# Patient Record
Sex: Female | Born: 1945 | Race: White | Hispanic: No | Marital: Married | Smoking: Never smoker
Health system: Southern US, Community
[De-identification: ages and names within clinical notes are randomized; demographics above are authoritative.]

## PROBLEM LIST (undated history)

## (undated) DIAGNOSIS — I10 Essential (primary) hypertension: Secondary | ICD-10-CM

## (undated) DIAGNOSIS — M160 Bilateral primary osteoarthritis of hip: Secondary | ICD-10-CM

## (undated) DIAGNOSIS — J189 Pneumonia, unspecified organism: Secondary | ICD-10-CM

## (undated) DIAGNOSIS — I441 Atrioventricular block, second degree: Secondary | ICD-10-CM

## (undated) DIAGNOSIS — F419 Anxiety disorder, unspecified: Secondary | ICD-10-CM

## (undated) DIAGNOSIS — H269 Unspecified cataract: Secondary | ICD-10-CM

## (undated) DIAGNOSIS — E119 Type 2 diabetes mellitus without complications: Secondary | ICD-10-CM

## (undated) DIAGNOSIS — E785 Hyperlipidemia, unspecified: Secondary | ICD-10-CM

## (undated) DIAGNOSIS — J40 Bronchitis, not specified as acute or chronic: Secondary | ICD-10-CM

## (undated) DIAGNOSIS — B029 Zoster without complications: Secondary | ICD-10-CM

## (undated) DIAGNOSIS — Z950 Presence of cardiac pacemaker: Secondary | ICD-10-CM

## (undated) DIAGNOSIS — C4490 Unspecified malignant neoplasm of skin, unspecified: Secondary | ICD-10-CM

## (undated) HISTORY — DX: Hyperlipidemia, unspecified: E78.5

## (undated) HISTORY — DX: Zoster without complications: B02.9

## (undated) HISTORY — DX: Anxiety disorder, unspecified: F41.9

## (undated) HISTORY — PX: SKIN CANCER EXCISION: SHX779

## (undated) HISTORY — PX: OTHER SURGICAL HISTORY: SHX169

## (undated) HISTORY — DX: Type 2 diabetes mellitus without complications: E11.9

## (undated) HISTORY — PX: JOINT REPLACEMENT: SHX530

## (undated) HISTORY — PX: TOTAL HIP ARTHROPLASTY: SHX124

## (undated) HISTORY — DX: Essential (primary) hypertension: I10

## (undated) HISTORY — DX: Atrioventricular block, second degree: I44.1

## (undated) HISTORY — PX: TONSILLECTOMY AND ADENOIDECTOMY: SUR1326

---

## 1969-02-10 DIAGNOSIS — J189 Pneumonia, unspecified organism: Secondary | ICD-10-CM

## 1969-02-10 HISTORY — DX: Pneumonia, unspecified organism: J18.9

## 2010-12-12 ENCOUNTER — Ambulatory Visit (HOSPITAL_COMMUNITY)
Admission: RE | Admit: 2010-12-12 | Discharge: 2010-12-12 | Disposition: A | Discharge status: Home / Self Care | Payer: 59 | Source: Ambulatory Visit | Attending: Orthopedic Surgery | Admitting: Orthopedic Surgery

## 2010-12-12 ENCOUNTER — Other Ambulatory Visit: Payer: Self-pay | Admitting: Orthopedic Surgery

## 2010-12-12 ENCOUNTER — Encounter (HOSPITAL_COMMUNITY): Payer: 59

## 2010-12-12 ENCOUNTER — Other Ambulatory Visit (HOSPITAL_COMMUNITY): Payer: Self-pay | Admitting: Orthopedic Surgery

## 2010-12-12 DIAGNOSIS — Z01818 Encounter for other preprocedural examination: Secondary | ICD-10-CM

## 2010-12-12 DIAGNOSIS — E7800 Pure hypercholesterolemia, unspecified: Secondary | ICD-10-CM | POA: Insufficient documentation

## 2010-12-12 DIAGNOSIS — I10 Essential (primary) hypertension: Secondary | ICD-10-CM | POA: Insufficient documentation

## 2010-12-12 DIAGNOSIS — Z79899 Other long term (current) drug therapy: Secondary | ICD-10-CM | POA: Insufficient documentation

## 2010-12-12 DIAGNOSIS — Z96649 Presence of unspecified artificial hip joint: Secondary | ICD-10-CM | POA: Insufficient documentation

## 2010-12-12 DIAGNOSIS — M169 Osteoarthritis of hip, unspecified: Secondary | ICD-10-CM | POA: Insufficient documentation

## 2010-12-12 DIAGNOSIS — Z01812 Encounter for preprocedural laboratory examination: Secondary | ICD-10-CM | POA: Insufficient documentation

## 2010-12-12 DIAGNOSIS — M1610 Unilateral primary osteoarthritis, unspecified hip: Secondary | ICD-10-CM | POA: Insufficient documentation

## 2010-12-12 LAB — COMPREHENSIVE METABOLIC PANEL
ALT: 21 U/L (ref 0–35)
Albumin: 4.9 g/dL (ref 3.5–5.2)
Alkaline Phosphatase: 81 U/L (ref 39–117)
BUN: 20 mg/dL (ref 6–23)
Chloride: 101 mEq/L (ref 96–112)
Potassium: 4.3 mEq/L (ref 3.5–5.1)
Sodium: 139 mEq/L (ref 135–145)
Total Bilirubin: 0.6 mg/dL (ref 0.3–1.2)

## 2010-12-12 LAB — PROTIME-INR: INR: 0.99 (ref 0.00–1.49)

## 2010-12-12 LAB — URINALYSIS, ROUTINE W REFLEX MICROSCOPIC
Bilirubin Urine: NEGATIVE
Glucose, UA: NEGATIVE mg/dL
Hgb urine dipstick: NEGATIVE
Protein, ur: NEGATIVE mg/dL
Specific Gravity, Urine: 1.011 (ref 1.005–1.030)
Urobilinogen, UA: 0.2 mg/dL (ref 0.0–1.0)

## 2010-12-12 LAB — CBC
HCT: 45.2 % (ref 36.0–46.0)
MCHC: 32.7 g/dL (ref 30.0–36.0)
MCV: 87.3 fL (ref 78.0–100.0)
RDW: 12.7 % (ref 11.5–15.5)

## 2010-12-21 ENCOUNTER — Inpatient Hospital Stay (HOSPITAL_COMMUNITY)
Admission: RE | Admit: 2010-12-21 | Discharge: 2010-12-25 | DRG: 470 | Disposition: A | Discharge status: Home Health | Payer: 59 | Source: Ambulatory Visit | Attending: Orthopedic Surgery | Admitting: Orthopedic Surgery

## 2010-12-21 ENCOUNTER — Inpatient Hospital Stay (HOSPITAL_COMMUNITY): Payer: 59

## 2010-12-21 DIAGNOSIS — M1610 Unilateral primary osteoarthritis, unspecified hip: Principal | ICD-10-CM | POA: Diagnosis present

## 2010-12-21 DIAGNOSIS — I498 Other specified cardiac arrhythmias: Secondary | ICD-10-CM | POA: Diagnosis not present

## 2010-12-21 DIAGNOSIS — Z01812 Encounter for preprocedural laboratory examination: Secondary | ICD-10-CM

## 2010-12-21 DIAGNOSIS — E785 Hyperlipidemia, unspecified: Secondary | ICD-10-CM | POA: Diagnosis present

## 2010-12-21 DIAGNOSIS — E871 Hypo-osmolality and hyponatremia: Secondary | ICD-10-CM | POA: Diagnosis not present

## 2010-12-21 DIAGNOSIS — Z96649 Presence of unspecified artificial hip joint: Secondary | ICD-10-CM

## 2010-12-21 DIAGNOSIS — M169 Osteoarthritis of hip, unspecified: Principal | ICD-10-CM | POA: Diagnosis present

## 2010-12-21 DIAGNOSIS — E119 Type 2 diabetes mellitus without complications: Secondary | ICD-10-CM | POA: Diagnosis present

## 2010-12-21 DIAGNOSIS — I10 Essential (primary) hypertension: Secondary | ICD-10-CM | POA: Diagnosis present

## 2010-12-21 DIAGNOSIS — D649 Anemia, unspecified: Secondary | ICD-10-CM | POA: Diagnosis not present

## 2010-12-21 LAB — GLUCOSE, CAPILLARY
Glucose-Capillary: 124 mg/dL — ABNORMAL HIGH (ref 70–99)
Glucose-Capillary: 113 mg/dL — ABNORMAL HIGH (ref 70–99)

## 2010-12-21 LAB — TYPE AND SCREEN: Antibody Screen: NEGATIVE

## 2010-12-22 DIAGNOSIS — I498 Other specified cardiac arrhythmias: Secondary | ICD-10-CM

## 2010-12-22 LAB — CBC
Hemoglobin: 9.9 g/dL — ABNORMAL LOW (ref 12.0–15.0)
Platelets: 169 10*3/uL (ref 150–400)
RBC: 3.45 MIL/uL — ABNORMAL LOW (ref 3.87–5.11)
WBC: 5.9 10*3/uL (ref 4.0–10.5)

## 2010-12-22 LAB — BASIC METABOLIC PANEL
CO2: 27 mEq/L (ref 19–32)
Calcium: 8.9 mg/dL (ref 8.4–10.5)
Glucose, Bld: 162 mg/dL — ABNORMAL HIGH (ref 70–99)
Potassium: 4.2 mEq/L (ref 3.5–5.1)
Sodium: 135 mEq/L (ref 135–145)

## 2010-12-22 LAB — GLUCOSE, CAPILLARY: Glucose-Capillary: 143 mg/dL — ABNORMAL HIGH (ref 70–99)

## 2010-12-23 DIAGNOSIS — E119 Type 2 diabetes mellitus without complications: Secondary | ICD-10-CM

## 2010-12-23 DIAGNOSIS — I441 Atrioventricular block, second degree: Secondary | ICD-10-CM

## 2010-12-23 LAB — BASIC METABOLIC PANEL
CO2: 25 mEq/L (ref 19–32)
Calcium: 8.8 mg/dL (ref 8.4–10.5)
GFR calc non Af Amer: >60 mL/min (ref >60)
Glucose, Bld: 140 mg/dL — ABNORMAL HIGH (ref 70–99)
Potassium: 3.5 mEq/L (ref 3.5–5.1)
Sodium: 131 mEq/L — ABNORMAL LOW (ref 135–145)

## 2010-12-23 LAB — CBC
HCT: 27.2 % — ABNORMAL LOW (ref 36.0–46.0)
MCV: 88 fL (ref 78.0–100.0)
Platelets: 164 10*3/uL (ref 150–400)
RBC: 3.09 MIL/uL — ABNORMAL LOW (ref 3.87–5.11)
RDW: 13.2 % (ref 11.5–15.5)
WBC: 8.3 10*3/uL (ref 4.0–10.5)

## 2010-12-23 LAB — MAGNESIUM: Magnesium: 2 mg/dL (ref 1.5–2.5)

## 2010-12-24 LAB — BASIC METABOLIC PANEL
CO2: 26 mEq/L (ref 19–32)
Chloride: 105 mEq/L (ref 96–112)
Glucose, Bld: 133 mg/dL — ABNORMAL HIGH (ref 70–99)
Sodium: 137 mEq/L (ref 135–145)

## 2010-12-24 LAB — CBC
Hemoglobin: 8.3 g/dL — ABNORMAL LOW (ref 12.0–15.0)
Platelets: 164 10*3/uL (ref 150–400)
RBC: 2.86 MIL/uL — ABNORMAL LOW (ref 3.87–5.11)
WBC: 8 10*3/uL (ref 4.0–10.5)

## 2010-12-26 NOTE — H&P (Signed)
[name], MA             ACCOUNT NO.:  [id number]  MEDICAL RECORD NO.:  [id number]  LOCATION:                                 FACILITY:  PHYSICIAN:  Lauren Norton, M.D.    DATE OF BIRTH:  February 12, 1946  DATE OF ADMISSION: DATE OF DISCHARGE:                             HISTORY & PHYSICAL   CHIEF COMPLAINT:  Left hip pain.  HISTORY OF PRESENT ILLNESS:  The patient is a 65 year old female who has been seen by Dr. Lequita Norton for ongoing left hip pain.  She has been told in the past that she has arthritis in the hip and that has progressively gotten worse with time.  She has been seen by Dr. Lequita Norton, where x-rays showed end-stage arthritis of the left hip.  She has been trying to exercise, do a lot of deep water aerobics.  She had a previous right hip replacement back in November 2001, but the left hip is bothering her. At this point, she would like to have it replaced.  Risks and benefits have been discussed.  She elects to proceed with surgery.  She has been seen by Dr. Felipa Norton preoperatively and felt to be stable for upcoming procedure.  ALLERGIES:  SULFA drugs.  CURRENT MEDICATIONS:  Lisinopril, atorvastatin, cetirizine.  PAST MEDICAL HISTORY: 1. Past history of shingles. 2. Hypertension. 3. Hypercholesterolemia. 4. Hemorrhoids. 5. Diet-controlled diabetes mellitus. 6. Postmenopausal. 7. Past history of urinary tract infection.  PAST SURGICAL HISTORY:  Right total hip replacement in November 2001.  FAMILY HISTORY:  Father with heart disease, diabetes, currently living at age 73.  Mother deceased at age 45 with Alzheimer's.  She has a 82- year-old sister who is in good health.  SOCIAL HISTORY:  Married, retired Runner, broadcasting/film/video.  Nonsmoker.  Occasional intake of alcohol socially; 3 children, all in good health.  The patient's husband and son will be assisting with care after surgery. She does have a living will.  REVIEW OF SYSTEMS:  GENERAL:  No fevers, chills, or night  sweats. NEURO:  No seizures, syncope, or paralysis.  RESPIRATORY:  She has some seasonal allergies but no shortness breath, productive cough, or hemoptysis.  CARDIOVASCULAR:  No chest pain, no orthopnea.  GI:  A little bit of constipation.  No nausea, vomiting, diarrhea.  GU:  Little bit of nocturia.  No dysuria or hematuria.  MUSCULOSKELETAL:  Hip pain.  PHYSICAL EXAMINATION:  VITAL SIGNS:  Pulse 84, respirations 16, blood pressure 130/68. GENERAL:  A 65 year old white female, well nourished, well developed, no acute distress.  She is alert, oriented, and cooperative. HEENT:  Normocephalic, atraumatic.  Pupils round and reactive.  EOMs intact.  Never wore glasses. NECK:  Supple. CHEST:  Clear. HEART:  Regular rate and rhythm without murmur. ABDOMEN:  Soft, nontender.  Bowel sounds present. RECTAL:  Not done, not pertinent to present illness. BREASTS:  Not done, not pertinent to present illness. GENITALIA:  Not done, not pertinent to present illness. EXTREMITIES:  Left hip flexion 90, 0 internal rotation, 0 external rotation, 20 degrees abduction.  IMPRESSION:  Osteoarthritis, left hip.  PLAN:  The patient will be admitted to [facility] to undergo left total hip replacement arthroplasty.  Surgery will be performed by Dr. Ollen Norton.     Lauren Norton, P.A.C.   ______________________________ Lauren Norton, M.D.    ALP/MEDQ  D:  12/18/2010  T:  12/18/2010  Job:  [id number]  cc:   Lauren Norton, M.D. Fax: [phone]  Lauren Norton, M.D. Fax: [phone]  Electronically Signed by Patrica Duel P.A.C. on 12/22/2010 10:43:54 AM Electronically Signed by Lauren Norton M.D. on 12/26/2010 06:52:04 AM

## 2010-12-26 NOTE — Op Note (Signed)
Lauren Norton, Lauren Norton             ACCOUNT NO.:  [id number]  MEDICAL RECORD NO.:  [id number]  LOCATION:  0003                         FACILITY:  [facility]  PHYSICIAN:  Ollen Gross, M.D.    DATE OF BIRTH:  07/30/1945  DATE OF PROCEDURE:  12/21/2010 DATE OF DISCHARGE:                              OPERATIVE REPORT   PREOPERATIVE DIAGNOSIS:  Osteoarthritis, left hip.  POSTOPERATIVE DIAGNOSIS:  Osteoarthritis, left hip.  PROCEDURE:  Left total hip arthroplasty.  SURGEON:  Ollen Gross, M.D.  ASSISTANT:  Alexzandrew L. Perkins, P.A.C.  ANESTHESIA:  Spinal.  ESTIMATED BLOOD LOSS:  250.Marland Kitchen  DRAIN:  Hemovac x1.  COMPLICATIONS:  None.  CONDITION:  Stable to recovery.  BRIEF CLINICAL NOTE:  Ms. Ke is a 65 year old female with end-stage arthritis of the left hip with progressively worsening pain and dysfunction.  She had a successful right total hip arthroplasty several years ago in [place] and presents now for left total hip arthroplasty.  PROCEDURE IN DETAIL:  After successful administration of spinal anesthetic, the patient was placed in the right lateral decubitus position with the left side up and held with the hip positioner.  The left lower extremity was isolated from her perineum with plastic drapes and prepped and draped in the usual sterile fashion.  Short posterolateral incision was made with a 10 blade through subcutaneous tissue to the level of fascia lata which was incised in line with the skin incision.  Sciatic nerve was palpated and protected and short rotators were isolated off the posterior femur.  Capsulotomy was performed and the hip was dislocated.  The center of the femoral head was marked and a trial prosthesis was placed such that the center of the trial head corresponds to the center of the native femoral head. Osteotomy line was marked on the femoral neck and osteotomy made with an oscillating saw.  Femoral head was removed and retractors were  placed around the proximal femur for exposure of the proximal femur.  Starter reamer was passed to the femoral canal and then the canal thoroughly irrigated to remove fatty contents.  Axial reaming was performed up to 15.5 mm, proximal reaming to 101F and the sleeve machined to a small.  A 101F small trial sleeve was placed.  The femur was then retracted anteriorly to gain acetabular exposure.  Acetabular retractors were placed and labrum and osteophytes were removed.  Acetabular reaming begins at 47 mm coursing increments of 2 to 53 mm and a 54-mm pinnacle acetabular shell was placed in anatomic position.  It was transfixed with 2 dome screws.  The apex hole eliminator was placed and the 36 mm neutral +4 marathon liner was placed.  The trial femoral stem was placed which is a 20 x 15 the 36 +8 matching native anteversion.  A 36.0 head was placed, reduced easily, so the +3 which had more appropriate soft tissue tension.  Stability excellent with full extension, full external rotation, 70 degrees flexion, 40 degrees adduction, 90 degrees internal rotation and 90 degrees of flexion and about 70 degrees of internal rotation.  By placing the left leg on top of the right, I felt as though the leg lengths were equal.  Hip was dislocated and trials removed. Permanent 64F small sleeve was placed, a 20 x 15 stem, 36 +8 neck matching native anteversion.  A 36 +3 ceramic head was placed and the hip reduced with same stability parameters.  The wound was copiously irrigated with saline solution and then the short rotators and capsule reattached to the femur through drill holes with Ethibond suture. Fascia lata was closed over Hemovac drain with interrupted #1 Vicryl, subcu closed with #1 and #2-0 Vicryl and subcuticular running 4-0 Monocryl.  Incision was then cleaned and dried and the catheter for Marcaine pain pump was placed and pump initiated.  Steri-Strips and a bulky sterile dressing were  applied, and then the patient was subsequently awakened and transported to recovery in stable condition.     Ollen Gross, M.D.     FA/MEDQ  D:  12/21/2010  T:  12/21/2010  Job:  [id number]  Electronically Signed by Ollen Gross M.D. on 12/26/2010 06:52:08 AM

## 2010-12-28 ENCOUNTER — Encounter: Payer: Self-pay | Admitting: Internal Medicine

## 2010-12-28 ENCOUNTER — Ambulatory Visit (INDEPENDENT_AMBULATORY_CARE_PROVIDER_SITE_OTHER): Payer: 59 | Admitting: Internal Medicine

## 2010-12-28 VITALS — BP 92/58 | HR 90 | Ht 67.0 in | Wt 146.0 lb

## 2010-12-28 DIAGNOSIS — I442 Atrioventricular block, complete: Secondary | ICD-10-CM | POA: Insufficient documentation

## 2010-12-28 DIAGNOSIS — D649 Anemia, unspecified: Secondary | ICD-10-CM | POA: Insufficient documentation

## 2010-12-28 DIAGNOSIS — I441 Atrioventricular block, second degree: Secondary | ICD-10-CM

## 2010-12-28 NOTE — Assessment & Plan Note (Signed)
She is recovering from recent post operative anemia. I have discussed the symptoms of anemia which could also be similar to symptoms of bradycardia. She will avoid excessive heat and maintain hydration. She will follow-up with her surgeon for repeat CBC.

## 2010-12-28 NOTE — Assessment & Plan Note (Addendum)
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  Presently, she is conducting 1:1. It is possible that her 2:1 AV block was a vagal response due to pain, narcotics, surgery, lying in bed, etc.  I think that before committing her to a pacemaker, we should evaluate this further. At this point, I will give her 2 weeks to recover from surgery and then place an event monitor.  IF during the 21 event monitor period she develops symptomatic 2:1 block or evidence of infrahisian block, then we will proceed with pacemaker implanation at that time.  If she has no evidence of high grade block or symptoms, then we will continue to follow conservatively.  She will report immediately for symptoms of presyncope or syncope.

## 2010-12-28 NOTE — Patient Instructions (Signed)
Your physician recommends that you schedule a follow-up appointment in: 6 weeks with Dr Allred  Your physician has recommended that you wear an event monitor. Event monitors are medical devices that record the heart's electrical activity. Doctors most often us these monitors to diagnose arrhythmias. Arrhythmias are problems with the speed or rhythm of the heartbeat. The monitor is a small, portable device. You can wear one while you do your normal daily activities. This is usually used to diagnose what is causing palpitations/syncope (passing out).    

## 2010-12-28 NOTE — Progress Notes (Signed)
Lauren Norton is a pleasant 65 y.o. WF patient with a h/o HTN and borderline diabetes who presents today for EP consultation regarding recently discovered 2:1 AV block.  The patient recently was hospitalized for THA at [facility].  During her hospital stay, she was observed to have 2:1 AV block with a narrow complex QRS.  She did not have prolonged RR intervals or symptoms.  She was observed with subsequent resumption of 1:1 AV conduction.  She was discharged with recommendation to be seen in my clinic today.  Since her discharge, she reports doing well.  She denies presyncope or syncope.  She has had no symptoms of bradycardia or AV block.  She continues to improve following her recent surgery. Upon further discussion, she has noticed intermittent episodes of fatigue for several years.  She states that she did not check her pulse during these episodes.  She reports occasional episodes of dizziness.  She denies presyncope or syncope.  Episodic fatigue has occurred every few weeks for several years.  Today, she denies symptoms of palpitations, chest pain, shortness of breath, orthopnea, PND, lower extremity edema, dizziness, presyncope, syncope, or neurologic sequela. The patient is tolerating medications without difficulties and is otherwise without complaint today.   Past Medical History  Diagnosis Date  . HLD (hyperlipidemia)   . HTN (hypertension)   . Second degree AV block     2:1 narrow complex QRS  . Glucose intolerance (impaired glucose tolerance)    Past Surgical History  Procedure Date  . Total hip arthroplasty     bilateral    Current Outpatient Prescriptions  Medication Sig Dispense Refill  . atorvastatin (LIPITOR) 20 MG tablet Take 20 mg by mouth daily.        . cetirizine (ZYRTEC) 10 MG tablet Take 10 mg by mouth daily.        . iron polysaccharides (NIFEREX) 150 MG capsule Take 150 mg by mouth daily.        Marland Kitchen lisinopril (PRINIVIL,ZESTRIL) 5 MG tablet Take 5 mg by mouth  daily.        . methocarbamol (ROBAXIN) 500 MG tablet Take 500 mg by mouth every 6 (six) hours as needed.        Marland Kitchen oxycodone (OXY-IR) 5 MG capsule Take 5 mg by mouth every 4 (four) hours as needed.        . rivaroxaban (XARELTO) 10 MG TABS tablet Take 10 mg by mouth daily.          Allergies  Allergen Reactions  . Sulfa Antibiotics     History   Social History  . Marital Status: Married    Spouse Name: N/A    Number of Children: 3  . Years of Education: N/A   Occupational History  . retired    Social History Main Topics  . Smoking status: Never Smoker   . Smokeless tobacco: Not on file  . Alcohol Use: 1.0 - 1.5 oz/week    2-3 drink(s) per week     wine or mixed drink  . Drug Use: No  . Sexually Active: Not on file   Other Topics Concern  . Not on file   Social History Narrative   Pt lives in [place].  Retired.    Family History  Problem Relation Age of Onset  . Heart attack    . Heart disease    . Diabetes      ROS- All systems are reviewed and negative except as per the  HPI above  Physical Exam: Filed Vitals:   12/28/10 0850  BP: 92/58  Pulse: 90  Height: 5\' 7"  (1.702 m)  Weight: 146 lb (66.225 kg)    GEN- The patient is well appearing, alert and oriented x 3 today.   Head- normocephalic, atraumatic Eyes-  Sclera clear, conjunctiva pink Ears- hearing intact Oropharynx- clear Neck- supple, no JVP Lymph- no cervical lymphadenopathy Lungs- Clear to ausculation bilaterally, normal work of breathing Heart- Regular rate and rhythm, no murmurs, rubs or gallops, PMI not laterally displaced GI- soft, NT, ND, + BS Extremities- no clubbing, cyanosis, 1+ R leg edema edema MS- s/p recent R leg THA Skin- no rash or lesion Psych- euthymic mood, full affect Neuro- strength and sensation are intact  EKG- today reveals sinus rhythm 90 bpm, 1:1 AV conduction and normal ekg Echo reviewed  Assessment and Plan:

## 2010-12-29 NOTE — Consult Note (Signed)
Lauren Norton, WARMOTH NO.:  [id number]  MEDICAL RECORD NO.:  [id number]  LOCATION:  1404                         FACILITY:  [facility]  PHYSICIAN:  Marca Ancona, MD      DATE OF BIRTH:  09-Sep-1945  DATE OF CONSULTATION:  12/22/2010 DATE OF DISCHARGE:                                CONSULTATION   PRIMARY CARE PHYSICIAN:  Larina Earthly, M.D.  HISTORY OF PRESENT ILLNESS:  This is a 65-year with history of hypertension, hyperlipidemia, diet-controlled diabetes who had a left total hip replacement yesterday.  Today, she was noted to be bradycardic with heart rate in the upper 40s.  She was not hypotensive but did get lightheaded with standing.  She did stand for the first time since surgery today.  In 2001, she had a right total hip replacement and remembers being told at that time that her heart rate was low.  She has no history of syncope.  A few months ago, she was taking lisinopril 10 mg daily and she noted lightheadedness with standing on that dose of lisinopril, however, her systolic blood pressure dropped to the 80s and 90s while standing.  Her heart rate was not low.  This resolved with decreased lisinopril to 5 mg daily.  The patient has had no chest pain. She does not really have significant exertional shortness of breath. She had been mainly limited by her hip pain prior to this hospitalization.  She was doing deep water aerobics and was actually doing quite well with that.  Review of EKG shows sinus rhythm with rate 48.  There do appear to be bigeminal block and PACs, however, I cannot fully rule out 2:1 AV block.  PAST MEDICAL HISTORY: 1. Hyperlipidemia. 2. Hypertension. 3. History of zoster 4. Total hip replacement on the right in 2001. 5. Diet-controlled diabetes. 6. History of a stress test and echocardiogram done in 2009 in [place].  According to her report, these were normal.  MEDICATIONS: 1. Lipitor 20 mg daily. 2. Xarelto 10 mg  daily.  SOCIAL HISTORY:  The patient has 3 children.  She is married. Occasionally drinks alcohol.  She does not smoke.  She is a retired Runner, broadcasting/film/video.  FAMILY HISTORY:  Mother with Alzheimer disease.  Father with coronary artery disease.  She had MI in her 62s.  REVIEW OF SYSTEMS:  All systems were reviewed and were negative except as noted in the history of present illness.  PHYSICAL EXAMINATION:  VITAL SIGNS:  Pulse is 48, temperatures 98.1, blood pressure ranges from 103 to 118 over 59 to 61, oxygen saturation 100% on 2 L nasal cannula. GENERAL:  This is a well-developed female, in no apparent stress. HEENT:  Normal exam. ABDOMEN:  Soft, nontender.  No hepatosplenomegaly. NECK:  JVP is about 8 cm of water.  There is no thyromegaly or thyroid nodule. CARDIOVASCULAR:  The patient is mildly bradycardic with a regular rhythm.  There is no murmur.  There is no S3 or S4.  There is trace ankle edema. LUNGS:  Clear to auscultation bilaterally with normal respiratory effort. EXTREMITIES:  No clubbing or cyanosis. NEUROLOGIC:  Alert and orient x3.  Normal affect. SKIN:  There is no rash.  RADIOLOGY AND LABORATORY DATA:  EKG shows sinus rhythm with a rate of 48.  There are bigeminal blocked PACs versus 2:1 AV block.  I did run a rhythm strip on her and there was no change during the rhythm strip.  White count 5.9, hematocrit 30.2, platelets 169, potassium 4.2, creatinine 0.76.  IMPRESSION:  This is a 65 year old status post left total hip replacement who was noted to have bradycardia with heart rate in the 40s.  EKG showed bigeminal block, PVCs versus 2:1 AV block.  Her blood pressure is preserved.  I am going to send her to a telemetry bed to monitor her heart rhythm overnight.  I will check a magnesium and TSH. We will get an echocardiogram, check her LV function.  She has already worked with Physical Therapy today and does not want to get back out of bed, however, tomorrow I would  like to check her heart rate response with exercise.  Her rhythm certainly may be bigeminal block, PACs; however, as mentioned above, I cannot fully rule out 2:1 AV block and she will need further investigation by checking her heart rate response to exercise and watching her on telemetry floor.     Marca Ancona, MD     DM/MEDQ  D:  12/22/2010  T:  12/22/2010  Job:  [id number]  Electronically Signed by Marca Ancona MD on 12/29/2010 01:41:21 PM

## 2011-01-04 ENCOUNTER — Institutional Professional Consult (permissible substitution): Payer: 59 | Admitting: Internal Medicine

## 2011-01-13 ENCOUNTER — Encounter (INDEPENDENT_AMBULATORY_CARE_PROVIDER_SITE_OTHER): Payer: 59

## 2011-01-13 DIAGNOSIS — I441 Atrioventricular block, second degree: Secondary | ICD-10-CM

## 2011-01-18 NOTE — Discharge Summary (Addendum)
Lauren Norton, Lauren Norton             ACCOUNT NO.:  [id number]  MEDICAL RECORD NO.:  [id number]  LOCATION:  1404                         FACILITY:  [facility]  PHYSICIAN:  Ollen Gross, M.D.    DATE OF BIRTH:  03/17/1946  DATE OF ADMISSION:  12/21/2010 DATE OF DISCHARGE:  12/25/2010                              DISCHARGE SUMMARY   ADMISSION DIAGNOSES: 1. Osteoarthritis of left hip. 2. History of shingles. 3. Hypertension. 4. Hypercholesterolemia. 5. Hemorrhoids. 6. Diet-controlled diabetes mellitus. 7. Postmenopausal. 8. Past history of urinary tract infections.  DISCHARGE DIAGNOSES: 1. Postop bradycardia 2. Osteoarthritis of left hip, status post left total hip replacement     arthroplasty. 3. Postop acute blood loss anemia. 4. Postop hyponatremia. 5. History of shingles. 6. Hypertension. 7. Hypercholesterolemia. 8. Hemorrhoids. 9. Diet-controlled diabetes mellitus. 10.Postmenopausal. 11.Past history of urinary tract infections.  PROCEDURE:  December 21, 2010, left total hip.  Surgeon, Dr. Homero Fellers Alieyah Norton. Assistant, Patrica Duel, P.A.C.  Spinal anesthesia.  Blood loss approximately 250 mL.  CONSULTS:  Cardiology consult.  BRIEF HISTORY:  The patient is a 65 year old female with end-stage arthritis of left hip, progressive worsening pain, dysfunction, successful right total hip, now presents for left total hip.  LABORATORY DATA:  Preop CBC not found in the chart, but the hemoglobin preop was 14.8.  Hemoglobin dropped down to 9.9, got as low as last number was 8.3 and 24.8 hematocrit.  Chem panel on admission is not scanned in the chart, but the sodium did drop from 135 to 131, back up to 137.  Remaining electrolytes remained within normal limits.  Blood group and type O negative.  X-rays postop, hip and pelvis film shows satisfactory position of left total hip.  HOSPITAL COURSE:  The patient was admitted to [facility] taken to OR, underwent above-stated  procedure without complication.  The patient tolerated the procedure well, later was transferred to recovery room, to the Orthopedic floor.  She did have some bradycardia through the night.  Her pulse was in the low at the 40s, but it had been up in the 70s, and on morning rounds, it kept dropping around the 40s.  The patient along with her husband states that she had this issue with a previous surgery years ago and had a cardiac workup back in [place] in 2009, prior to moving to [place], which was found to be negative. She has not been seen by Cardiology here, but Dr. Peter Swaziland is taking care with the family and we called in a Cardiology consult.  The patient was seen in evaluation by Cardiology and felt like it could be an AV block.  They were monitored, may benefit from a pacemaker. They held any type of blocking agents and wanted her to be moved to telemetry, which she was placed on and she ambulated while on telemetry. By the following day, she had an echo done and it felt like she may need a pacemaker, but possibly could do it on an outpatient basis.  She did have a drop in her sodium, which was noted on day 2, felt to be a dilutional component.  So,  we will give the order for fluids and Hep- Lock  IV once she was tolerating fluids well.  Hemoglobin was down to 8.8.  We put her on some iron supplements.  If she became symptomatic, then we will give her blood.  Her ACE inhibitor was on hold because of the soft pressure, but again, she was asymptomatic with this.  She did have a bradycardia on the first day or two, but by day 3, her pulse rate was back up in the 60s.  She actually did well with her physical therapy over the weekend, walking on Saturday and even on Sunday, she was doing well.  She had been seen back by Cardiology and they felt that she would likely need a pacer on Monday.  She was asymptomatic with her anemia, so they felt like she would not require transfusion.   She was seen on that Saturday by Cardiology and then on Sunday, December 25, 2010, she was doing well.  The heart block had pretty much resolved and it was decided that she would not undergo the pacemaker placement, that she would be followed back as an outpatient with Cardiology.  She was doing well from her medical standpoint and also from ortho standpoint.  So she was discharged home later that day on December 25, 2010.  DISCHARGE PLAN: 1. The patient discharged home on December 25, 2010. 2. Discharge diagnoses, please see above. 3. Discharge meds. 4. The patient is to follow up with Dr. Lequita Halt in the office in     approximately 2 weeks from surgery.  The patient is also to follow     up with Cardiology a week after discharge or within a week after     discharge.  ACTIVITY:  She is partial weightbearing, 25 to 50%.  Hip precautions, total hip protocol.  DISPOSITION:  Home.  CONDITION ON DISCHARGE:  Improved.     Lauren Norton, P.A.C.   ______________________________ Ollen Gross, M.D.    ALP/MEDQ  D:  01/12/2011  T:  01/13/2011  Job:  [id number]  cc:   Huel Cote, M.D. Fax: [phone]  Larina Earthly, M.D. Fax: [phone]  Electronically Signed by Patrica Duel P.A.C. on 01/18/2011 07:46:25 AM Electronically Signed by Ollen Gross M.D. on 01/23/2011 11:35:05 AM

## 2011-02-06 ENCOUNTER — Encounter: Payer: Self-pay | Admitting: Internal Medicine

## 2011-02-06 ENCOUNTER — Ambulatory Visit (INDEPENDENT_AMBULATORY_CARE_PROVIDER_SITE_OTHER): Payer: 59 | Admitting: Internal Medicine

## 2011-02-06 VITALS — BP 142/92 | HR 80 | Ht 67.0 in | Wt 140.0 lb

## 2011-02-06 DIAGNOSIS — I441 Atrioventricular block, second degree: Secondary | ICD-10-CM

## 2011-02-06 NOTE — Progress Notes (Signed)
Lauren Norton is a pleasant 65 y.o. WF patient with a h/o HTN and borderline diabetes who presents today for EP follow-up regarding recently discovered 2:1 AV block.  The patient recently was hospitalized for THA at [facility].  During her hospital stay, she was observed to have 2:1 AV block with a narrow complex QRS.  She did not have prolonged RR intervals or symptoms.  She was observed with subsequent resumption of 1:1 AV conduction.    Since last being seen in our office, she reports doing well. Her exercise ability has improved significantly since her hip surgery. She denies presyncope or syncope.  She has had no symptoms of bradycardia or AV block.    Today, she denies symptoms of palpitations, chest pain, shortness of breath, orthopnea, PND, lower extremity edema, dizziness, presyncope, syncope, or neurologic sequela. The patient is tolerating medications without difficulties and is otherwise without complaint today.   Past Medical History  Diagnosis Date  . HLD (hyperlipidemia)   . HTN (hypertension)   . Second degree AV block     2:1 narrow complex QRS  . Glucose intolerance (impaired glucose tolerance)    Past Surgical History  Procedure Date  . Total hip arthroplasty     bilateral    Current Outpatient Prescriptions  Medication Sig Dispense Refill  . aspirin 81 MG tablet Take 81 mg by mouth daily.        Marland Kitchen atorvastatin (LIPITOR) 20 MG tablet Take 20 mg by mouth daily.        . cetirizine (ZYRTEC) 10 MG tablet Take 10 mg by mouth daily.          Allergies  Allergen Reactions  . Sulfa Antibiotics     History   Social History  . Marital Status: Married    Spouse Name: N/A    Number of Children: 3  . Years of Education: N/A   Occupational History  . retired    Social History Main Topics  . Smoking status: Never Smoker   . Smokeless tobacco: Not on file  . Alcohol Use: 1.0 - 1.5 oz/week    2-3 drink(s) per week     wine or mixed drink  . Drug Use: No  .  Sexually Active: Not on file   Other Topics Concern  . Not on file   Social History Narrative   Pt lives in [place].  Retired.    Family History  Problem Relation Age of Onset  . Heart attack    . Heart disease    . Diabetes      Physical Exam: Filed Vitals:   02/06/11 1014  BP: 142/92  Pulse: 80  Height: 5\' 7"  (1.702 m)  Weight: 140 lb (63.504 kg)    GEN- The patient is well appearing, alert and oriented x 3 today.   Head- normocephalic, atraumatic Eyes-  Sclera clear, conjunctiva pink Ears- hearing intact Oropharynx- clear Neck- supple, no JVP Lymph- no cervical lymphadenopathy Lungs- Clear to ausculation bilaterally, normal work of breathing Heart- Regular rate and rhythm, no murmurs, rubs or gallops, PMI not laterally displaced GI- soft, NT, ND, + BS Extremities- no clubbing, cyanosis,  MS- s/p recent R leg THA but much more mobile today Skin- no rash or lesion Psych- euthymic mood, full affect Neuro- strength and sensation are intact  EKG- today reveals sinus rhythm 71bpm, 1:1 AV conduction, poor R wave progression  Assessment and Plan:

## 2011-02-06 NOTE — Patient Instructions (Signed)
Your physician recommends that you schedule a follow-up appointment in 3 months with Dr Allred    

## 2011-02-06 NOTE — Assessment & Plan Note (Signed)
Doing very well and asymptomatic at this time I have reviewed her recent event monitor in detail.  This revealed predominantly 1:1 AV conduction.  She did have rare 2:1 AV conduction but no more advanced AV block or prolonged RR intervals. We will therefore continue watchful waiting at this time.  She would like to avoid a pacemaker if possible.  Though she may eventually require pacing, she appears stable at this time. I will see her again in 3 months.

## 2011-02-20 NOTE — Progress Notes (Signed)
Addended by: Judithe Modest D on: 02/20/2011 02:31 PM   Modules accepted: Orders

## 2011-05-02 ENCOUNTER — Telehealth: Payer: Self-pay | Admitting: Cardiovascular Disease

## 2011-05-02 NOTE — Telephone Encounter (Signed)
New problem Pt's husband calling about wife's claim for [facility] [phone]. Please call

## 2011-05-02 NOTE — Telephone Encounter (Signed)
Patient's husband called with a question about wife's bill.Professional Fee Billing [phone] given.

## 2011-05-08 ENCOUNTER — Encounter: Payer: Self-pay | Admitting: Internal Medicine

## 2011-05-08 ENCOUNTER — Ambulatory Visit (INDEPENDENT_AMBULATORY_CARE_PROVIDER_SITE_OTHER): Payer: Medicare Other | Admitting: Internal Medicine

## 2011-05-08 DIAGNOSIS — I459 Conduction disorder, unspecified: Secondary | ICD-10-CM

## 2011-05-08 DIAGNOSIS — I10 Essential (primary) hypertension: Secondary | ICD-10-CM

## 2011-05-08 MED ORDER — LISINOPRIL 5 MG PO TABS: 2.5000 mg | ORAL_TABLET | Freq: Every day | ORAL | Status: DC

## 2011-05-08 NOTE — Assessment & Plan Note (Signed)
Resolved We will therefore continue watchful waiting at this time.  She would like to avoid a pacemaker if possible.  Though she may eventually require pacing, she appears stable at this time.  She will contact me if she develops presyncope, syncope, or any other symptoms. I will see her again in 12 months.  She should avoid av nodal agents in the future

## 2011-05-08 NOTE — Progress Notes (Signed)
Lauren Norton is a pleasant 65 y.o. WF patient with a h/o HTN and borderline diabetes who presents today for EP follow-up regarding recently discovered 2:1 AV block.  The patient recently was hospitalized for THA at [facility].  During her hospital stay, she was observed to have 2:1 AV block with a narrow complex QRS.  She did not have prolonged RR intervals or symptoms.  She was observed with subsequent resumption of 1:1 AV conduction.    Since last being seen in our office, she reports doing well. She has resumed normal activity.  She denies dizziness, presyncope or syncope.  She has had no symptoms of bradycardia or AV block.    Today, she denies symptoms of palpitations, chest pain, shortness of breath, orthopnea, PND, lower extremity edema, dizziness, presyncope, syncope, or neurologic sequela. The patient is tolerating medications without difficulties and is otherwise without complaint today.   Past Medical History  Diagnosis Date  . HLD (hyperlipidemia)   . HTN (hypertension)   . Second degree AV block     2:1 narrow complex QRS  . Glucose intolerance (impaired glucose tolerance)    Past Surgical History  Procedure Date  . Total hip arthroplasty     bilateral    Current Outpatient Prescriptions  Medication Sig Dispense Refill  . atorvastatin (LIPITOR) 20 MG tablet Take 20 mg by mouth daily.        . cetirizine (ZYRTEC) 10 MG tablet Take 5 mg by mouth daily.       Marland Kitchen aspirin 81 MG tablet Take 81 mg by mouth daily.          Allergies  Allergen Reactions  . Sulfa Antibiotics     History   Social History  . Marital Status: Married    Spouse Name: N/A    Number of Children: 3  . Years of Education: N/A   Occupational History  . retired    Social History Main Topics  . Smoking status: Never Smoker   . Smokeless tobacco: Not on file  . Alcohol Use: 1.0 - 1.5 oz/week    2-3 drink(s) per week     wine or mixed drink  . Drug Use: No  . Sexually Active: Not on file     Other Topics Concern  . Not on file   Social History Narrative   Pt lives in [place].  Retired.    Family History  Problem Relation Age of Onset  . Heart attack    . Heart disease    . Diabetes      Physical Exam: Filed Vitals:   05/08/11 1408  BP: 150/82  Pulse: 77  Resp: 18  Height: 5\' 7"  (1.702 m)  Weight: 140 lb 12.8 oz (63.866 kg)    GEN- The patient is well appearing, alert and oriented x 3 today.   Head- normocephalic, atraumatic Eyes-  Sclera clear, conjunctiva pink Ears- hearing intact Oropharynx- clear Neck- supple, no JVP Lymph- no cervical lymphadenopathy Lungs- Clear to ausculation bilaterally, normal work of breathing Heart- Regular rate and rhythm, no murmurs, rubs or gallops, PMI not laterally displaced GI- soft, NT, ND, + BS Extremities- no clubbing, cyanosis,  MS- s/p recent R leg THA but much more mobile today Skin- no rash or lesion Psych- euthymic mood, full affect Neuro- strength and sensation are intact  EKG- today reveals sinus rhythm 84bpm, 1:1 AV conduction, poor R wave progression Echo 7/12 reviewed  Assessment and Plan:

## 2011-05-08 NOTE — Patient Instructions (Signed)
Your physician wants you to follow-up in: 12 months with Dr Jacquiline Doe will receive a reminder letter in the mail two months in advance. If you don't receive a letter, please call our office to schedule the follow-up   Your physician has recommended you make the following change in your medication:  1) Start Lisinopril 2.5mg  daily (1/2 of a 5mg  tablet)   Your physician recommends that you return for lab work in: 3 months with Dr Ava--BMP  .

## 2011-05-08 NOTE — Assessment & Plan Note (Signed)
Above goal today She reports symptomatic hypotension with lisinopril 5mg  previously We will therefore start lisinopril 2.5mg  daily today. She will follow-up with Dr Felipa Eth for BMET and further BP eval in 3 months  I will see her again in a year.

## 2011-06-08 ENCOUNTER — Encounter: Payer: Self-pay | Admitting: Internal Medicine

## 2011-07-20 DIAGNOSIS — L723 Sebaceous cyst: Secondary | ICD-10-CM | POA: Diagnosis not present

## 2011-08-09 DIAGNOSIS — R030 Elevated blood-pressure reading, without diagnosis of hypertension: Secondary | ICD-10-CM | POA: Diagnosis not present

## 2011-08-09 DIAGNOSIS — I10 Essential (primary) hypertension: Secondary | ICD-10-CM | POA: Diagnosis not present

## 2011-08-09 DIAGNOSIS — E119 Type 2 diabetes mellitus without complications: Secondary | ICD-10-CM | POA: Diagnosis not present

## 2012-01-11 DIAGNOSIS — S8990XA Unspecified injury of unspecified lower leg, initial encounter: Secondary | ICD-10-CM | POA: Diagnosis not present

## 2012-01-11 DIAGNOSIS — S99919A Unspecified injury of unspecified ankle, initial encounter: Secondary | ICD-10-CM | POA: Diagnosis not present

## 2012-01-11 DIAGNOSIS — X58XXXA Exposure to other specified factors, initial encounter: Secondary | ICD-10-CM | POA: Diagnosis not present

## 2012-01-11 DIAGNOSIS — M25579 Pain in unspecified ankle and joints of unspecified foot: Secondary | ICD-10-CM | POA: Diagnosis not present

## 2012-01-11 DIAGNOSIS — M25473 Effusion, unspecified ankle: Secondary | ICD-10-CM | POA: Diagnosis not present

## 2012-01-11 DIAGNOSIS — S93409A Sprain of unspecified ligament of unspecified ankle, initial encounter: Secondary | ICD-10-CM | POA: Diagnosis not present

## 2012-01-11 DIAGNOSIS — M25476 Effusion, unspecified foot: Secondary | ICD-10-CM | POA: Diagnosis not present

## 2012-01-31 ENCOUNTER — Telehealth: Payer: Self-pay | Admitting: Internal Medicine

## 2012-01-31 DIAGNOSIS — I459 Conduction disorder, unspecified: Secondary | ICD-10-CM

## 2012-01-31 NOTE — Telephone Encounter (Signed)
Pt has long history of second degree block but is concerned with consistency of low HR. Over past month BP high is 116/59 p 59 to lots of readings 111/43 p 43, 120/45 p 39. States she is not dizzy or SOB, c/o fatigue after tasks such as making dinner. Pt told DR/Nurse will call her Thursday  or  Friday with advise but to call back if any worsening symptoms or further concerns, pt agreeable to plan.  Will forward to DR/Nurse

## 2012-01-31 NOTE — Telephone Encounter (Signed)
New msg Pt has been having low BP 117/44 pulse 41. Pt said she if feeling ok. Please call

## 2012-02-01 NOTE — Telephone Encounter (Signed)
Please place a 24 hour holter to evaluate heart rates

## 2012-02-01 NOTE — Telephone Encounter (Signed)
Fu call °Pt returning your call  °

## 2012-02-01 NOTE — Telephone Encounter (Signed)
lmom for patient to call me back with Dr Jenel Lucks recommendations

## 2012-02-01 NOTE — Telephone Encounter (Signed)
To get monitor to evaluate heart rate

## 2012-02-02 ENCOUNTER — Telehealth: Payer: Self-pay

## 2012-02-02 NOTE — Telephone Encounter (Signed)
Follow-up:    Patient returned your call.  Please call back. 

## 2012-02-02 NOTE — Telephone Encounter (Signed)
Left message for pt to call.

## 2012-02-08 ENCOUNTER — Encounter (INDEPENDENT_AMBULATORY_CARE_PROVIDER_SITE_OTHER): Payer: Medicare Other

## 2012-02-08 DIAGNOSIS — I459 Conduction disorder, unspecified: Secondary | ICD-10-CM | POA: Diagnosis not present

## 2012-02-08 NOTE — Telephone Encounter (Signed)
PT HAD MONITOR TODAY

## 2012-02-13 NOTE — Telephone Encounter (Signed)
Patient came in on Thurs and wore monitor for 24 hours

## 2012-02-13 NOTE — Telephone Encounter (Signed)
Dr Johney Frame has reviewed and wants to see her back in 1-2 weeks for HB  Patient aware and will come in on 02/19/12 at 10:30

## 2012-02-19 ENCOUNTER — Encounter: Payer: Self-pay | Admitting: *Deleted

## 2012-02-19 ENCOUNTER — Encounter: Payer: Self-pay | Admitting: Internal Medicine

## 2012-02-19 ENCOUNTER — Ambulatory Visit (INDEPENDENT_AMBULATORY_CARE_PROVIDER_SITE_OTHER): Payer: Medicare Other | Admitting: Internal Medicine

## 2012-02-19 VITALS — BP 149/76 | HR 47 | Ht 67.5 in | Wt 147.0 lb

## 2012-02-19 DIAGNOSIS — I441 Atrioventricular block, second degree: Secondary | ICD-10-CM | POA: Diagnosis not present

## 2012-02-19 DIAGNOSIS — I10 Essential (primary) hypertension: Secondary | ICD-10-CM | POA: Diagnosis not present

## 2012-02-19 LAB — CBC WITH DIFFERENTIAL/PLATELET
Basophils Absolute: 0 10*3/uL (ref 0.0–0.1)
Basophils Relative: 0.3 % (ref 0.0–3.0)
Eosinophils Relative: 1.5 % (ref 0.0–5.0)
HCT: 42.7 % (ref 36.0–46.0)
Hemoglobin: 14 g/dL (ref 12.0–15.0)
Lymphocytes Relative: 27.2 % (ref 12.0–46.0)
Lymphs Abs: 1.6 10*3/uL (ref 0.7–4.0)
Monocytes Relative: 11.8 % (ref 3.0–12.0)
Neutro Abs: 3.5 10*3/uL (ref 1.4–7.7)
RBC: 4.8 Mil/uL (ref 3.87–5.11)
WBC: 6 10*3/uL (ref 4.5–10.5)

## 2012-02-19 LAB — BASIC METABOLIC PANEL
Calcium: 10.4 mg/dL (ref 8.4–10.5)
GFR: 104.37 mL/min (ref >60.00)
Glucose, Bld: 121 mg/dL — ABNORMAL HIGH (ref 70–99)
Potassium: 4.3 mEq/L (ref 3.5–5.1)
Sodium: 138 mEq/L (ref 135–145)

## 2012-02-19 NOTE — Assessment & Plan Note (Signed)
Recent event monitor documents multiple episodes of mobitz II second degree AV block.  She is clearly symptomatic with this.  No reversible causes are evidence. I would therefore recommend pacemaker implantation at this time.  Risks, benefits, alternatives to pacemaker implantation were discussed in detail with the patient today. The patient understands that the risks include but are not limited to bleeding, infection, pneumothorax, perforation, tamponade, vascular damage, renal failure, MI, stroke, death,  and lead dislodgement and wishes to proceed. We will therefore schedule the procedure at the next available time.

## 2012-02-19 NOTE — Progress Notes (Signed)
Lauren Norton is a pleasant 65 y.o. WF patient with a h/o HTN and borderline diabetes who presents today for EP follow-up regarding second degree AV block.  Since last being seen in our office, she reports doing well. She has resumed normal activity.  She become more symptomatic with AV block.  She reports symptoms of fatigue and occasional dizziness.  She reports decreased exercise tolerance and finds that with activity her heart rate is frequently 40s.  She has had two episodes of transient LOC (for only several seconds). Today, she denies symptoms of palpitations, chest pain, shortness of breath, or lower extremity edema. The patient is tolerating medications without difficulties and is otherwise without complaint today.   Past Medical History  Diagnosis Date  . HLD (hyperlipidemia)   . HTN (hypertension)   . Second degree AV block     2:1 narrow complex QRS  . Glucose intolerance (impaired glucose tolerance)    Past Surgical History  Procedure Date  . Total hip arthroplasty     bilateral    Current Outpatient Prescriptions  Medication Sig Dispense Refill  . atorvastatin (LIPITOR) 20 MG tablet Take 20 mg by mouth daily.        . cetirizine (ZYRTEC) 10 MG tablet Take 5 mg by mouth as needed.       . lisinopril (PRINIVIL,ZESTRIL) 5 MG tablet Take 0.5 tablets (2.5 mg total) by mouth daily.  30 tablet  11    Allergies  Allergen Reactions  . Sulfa Antibiotics     History   Social History  . Marital Status: Married    Spouse Name: N/A    Number of Children: 3  . Years of Education: N/A   Occupational History  . retired    Social History Main Topics  . Smoking status: Never Smoker   . Smokeless tobacco: Not on file  . Alcohol Use: 1.0 - 1.5 oz/week    2-3 drink(s) per week     wine or mixed drink  . Drug Use: No  . Sexually Active: Not on file   Other Topics Concern  . Not on file   Social History Narrative   Pt lives in [place].  Retired.    Family  History  Problem Relation Age of Onset  . Heart attack    . Heart disease    . Diabetes      Physical Exam: Filed Vitals:   02/19/12 1044  BP: 149/76  Pulse: 47  Height: 5' 7.5" (1.715 m)  Weight: 147 lb (66.679 kg)    GEN- The patient is well appearing, alert and oriented x 3 today.   Head- normocephalic, atraumatic Eyes-  Sclera clear, conjunctiva pink Ears- hearing intact Oropharynx- clear Neck- supple, no JVP Lymph- no cervical lymphadenopathy Lungs- Clear to ausculation bilaterally, normal work of breathing Heart- Regular rate and rhythm, no murmurs, rubs or gallops, PMI not laterally displaced GI- soft, NT, ND, + BS Extremities- no clubbing, cyanosis,  MS- mild swelling in R ankle Skin- no rash or lesion Psych- euthymic mood, full affect Neuro- strength and sensation are intact  EKG- today reveals sinus rhythm 72bpm, 1:1 AV conduction, incomplete RBBB  Echo 7/12 reviewed  Holter monitor 8/13 reviewed  Assessment and Plan:  

## 2012-02-19 NOTE — Assessment & Plan Note (Signed)
Stable No change required today  

## 2012-02-19 NOTE — Patient Instructions (Addendum)

## 2012-02-20 ENCOUNTER — Encounter (HOSPITAL_COMMUNITY): Payer: Self-pay | Admitting: Respiratory Therapy

## 2012-02-20 DIAGNOSIS — M169 Osteoarthritis of hip, unspecified: Secondary | ICD-10-CM | POA: Diagnosis not present

## 2012-02-26 DIAGNOSIS — Z79899 Other long term (current) drug therapy: Secondary | ICD-10-CM | POA: Diagnosis not present

## 2012-02-26 DIAGNOSIS — I441 Atrioventricular block, second degree: Secondary | ICD-10-CM | POA: Diagnosis not present

## 2012-02-26 DIAGNOSIS — I10 Essential (primary) hypertension: Secondary | ICD-10-CM | POA: Diagnosis not present

## 2012-02-26 DIAGNOSIS — R7309 Other abnormal glucose: Secondary | ICD-10-CM | POA: Diagnosis not present

## 2012-02-26 DIAGNOSIS — Z01812 Encounter for preprocedural laboratory examination: Secondary | ICD-10-CM | POA: Diagnosis not present

## 2012-02-26 DIAGNOSIS — E785 Hyperlipidemia, unspecified: Secondary | ICD-10-CM | POA: Diagnosis not present

## 2012-02-26 DIAGNOSIS — Z7902 Long term (current) use of antithrombotics/antiplatelets: Secondary | ICD-10-CM | POA: Diagnosis not present

## 2012-02-26 MED ORDER — CEFAZOLIN SODIUM-DEXTROSE 2-3 GM-% IV SOLR
2.0000 g | INTRAVENOUS | Status: DC
  Filled 2012-02-26 (×2): qty 50

## 2012-02-26 MED ORDER — SODIUM CHLORIDE 0.9 % IR SOLN
80.0000 mg | Status: DC
  Filled 2012-02-26: qty 2

## 2012-02-27 ENCOUNTER — Ambulatory Visit (HOSPITAL_COMMUNITY): Payer: Medicare Other

## 2012-02-27 ENCOUNTER — Encounter (HOSPITAL_COMMUNITY): Payer: Self-pay | Admitting: General Practice

## 2012-02-27 ENCOUNTER — Encounter (HOSPITAL_COMMUNITY)
Admission: RE | Disposition: A | Discharge status: Home / Self Care | Payer: Self-pay | Source: Ambulatory Visit | Attending: Internal Medicine

## 2012-02-27 ENCOUNTER — Ambulatory Visit (HOSPITAL_COMMUNITY)
Admission: RE | Admit: 2012-02-27 | Discharge: 2012-02-28 | Disposition: A | Discharge status: Home / Self Care | Payer: Medicare Other | Source: Ambulatory Visit | Attending: Internal Medicine | Admitting: Internal Medicine

## 2012-02-27 DIAGNOSIS — I10 Essential (primary) hypertension: Secondary | ICD-10-CM | POA: Insufficient documentation

## 2012-02-27 DIAGNOSIS — I441 Atrioventricular block, second degree: Secondary | ICD-10-CM

## 2012-02-27 DIAGNOSIS — D649 Anemia, unspecified: Secondary | ICD-10-CM

## 2012-02-27 DIAGNOSIS — R7309 Other abnormal glucose: Secondary | ICD-10-CM | POA: Insufficient documentation

## 2012-02-27 DIAGNOSIS — Z79899 Other long term (current) drug therapy: Secondary | ICD-10-CM | POA: Insufficient documentation

## 2012-02-27 DIAGNOSIS — Z01811 Encounter for preprocedural respiratory examination: Secondary | ICD-10-CM | POA: Diagnosis not present

## 2012-02-27 DIAGNOSIS — Z01812 Encounter for preprocedural laboratory examination: Secondary | ICD-10-CM | POA: Insufficient documentation

## 2012-02-27 DIAGNOSIS — E785 Hyperlipidemia, unspecified: Secondary | ICD-10-CM | POA: Insufficient documentation

## 2012-02-27 DIAGNOSIS — Z7902 Long term (current) use of antithrombotics/antiplatelets: Secondary | ICD-10-CM | POA: Insufficient documentation

## 2012-02-27 DIAGNOSIS — I442 Atrioventricular block, complete: Secondary | ICD-10-CM | POA: Diagnosis present

## 2012-02-27 HISTORY — PX: PERMANENT PACEMAKER INSERTION: SHX5480

## 2012-02-27 HISTORY — DX: Bilateral primary osteoarthritis of hip: M16.0

## 2012-02-27 HISTORY — DX: Unspecified malignant neoplasm of skin, unspecified: C44.90

## 2012-02-27 HISTORY — DX: Pneumonia, unspecified organism: J18.9

## 2012-02-27 SURGERY — PERMANENT PACEMAKER INSERTION
Anesthesia: LOCAL

## 2012-02-27 MED ORDER — LIDOCAINE HCL (PF) 1 % IJ SOLN
INTRAMUSCULAR | Status: AC
  Filled 2012-02-27: qty 30

## 2012-02-27 MED ORDER — SODIUM CHLORIDE 0.9 % IV SOLN: 250.0000 mL | INTRAVENOUS | Status: DC | PRN

## 2012-02-27 MED ORDER — CEFAZOLIN SODIUM 1-5 GM-% IV SOLN
1.0000 g | Freq: Four times a day (QID) | INTRAVENOUS | Status: AC
  Administered 2012-02-27 – 2012-02-28 (×3): 1 g via INTRAVENOUS
  Filled 2012-02-27 (×3): qty 50

## 2012-02-27 MED ORDER — PNEUMOCOCCAL VAC POLYVALENT 25 MCG/0.5ML IJ INJ
0.5000 mL | INJECTION | INTRAMUSCULAR | Status: DC
  Filled 2012-02-27: qty 0.5

## 2012-02-27 MED ORDER — ONDANSETRON HCL 4 MG/2ML IJ SOLN: 4.0000 mg | Freq: Four times a day (QID) | INTRAMUSCULAR | Status: DC | PRN

## 2012-02-27 MED ORDER — SODIUM CHLORIDE 0.45 % IV SOLN
INTRAVENOUS | Status: DC
  Administered 2012-02-27: 13:00:00 via INTRAVENOUS

## 2012-02-27 MED ORDER — LIDOCAINE HCL (PF) 1 % IJ SOLN
INTRAMUSCULAR | Status: AC
  Filled 2012-02-27: qty 60

## 2012-02-27 MED ORDER — LISINOPRIL 5 MG PO TABS
5.0000 mg | ORAL_TABLET | Freq: Every day | ORAL | Status: DC
  Administered 2012-02-27: 18:00:00 5 mg via ORAL
  Filled 2012-02-27 (×2): qty 1

## 2012-02-27 MED ORDER — FENTANYL CITRATE 0.05 MG/ML IJ SOLN
INTRAMUSCULAR | Status: AC
  Filled 2012-02-27: qty 2

## 2012-02-27 MED ORDER — MIDAZOLAM HCL 5 MG/5ML IJ SOLN
INTRAMUSCULAR | Status: AC
  Filled 2012-02-27: qty 5

## 2012-02-27 MED ORDER — HYDROCODONE-ACETAMINOPHEN 5-325 MG PO TABS
1.0000 | ORAL_TABLET | ORAL | Status: DC | PRN
  Administered 2012-02-27 – 2012-02-28 (×2): 1 via ORAL
  Filled 2012-02-27 (×2): qty 1

## 2012-02-27 MED ORDER — SODIUM CHLORIDE 0.9 % IV SOLN
250.0000 mL | INTRAVENOUS | Status: DC
Start: 2012-02-27 — End: 2012-02-27

## 2012-02-27 MED ORDER — MUPIROCIN 2 % EX OINT
TOPICAL_OINTMENT | CUTANEOUS | Status: AC
  Filled 2012-02-27: qty 22

## 2012-02-27 MED ORDER — SODIUM CHLORIDE 0.9 % IJ SOLN: 3.0000 mL | Freq: Two times a day (BID) | INTRAMUSCULAR | Status: DC

## 2012-02-27 MED ORDER — CHLORHEXIDINE GLUCONATE 4 % EX LIQD
60.0000 mL | Freq: Once | CUTANEOUS | Status: DC
  Filled 2012-02-27: qty 60

## 2012-02-27 MED ORDER — MUPIROCIN 2 % EX OINT
TOPICAL_OINTMENT | Freq: Two times a day (BID) | CUTANEOUS | Status: DC
Start: 2012-02-27 — End: 2012-02-27
  Administered 2012-02-27: 13:00:00 via NASAL
  Filled 2012-02-27: qty 22

## 2012-02-27 MED ORDER — YOU HAVE A PACEMAKER BOOK
Freq: Once | Status: AC
  Administered 2012-02-27: 21:00:00
  Filled 2012-02-27: qty 1

## 2012-02-27 MED ORDER — SODIUM CHLORIDE 0.9 % IJ SOLN: 3.0000 mL | INTRAMUSCULAR | Status: DC | PRN

## 2012-02-27 MED ORDER — ACETAMINOPHEN 325 MG PO TABS: 325.0000 mg | ORAL_TABLET | ORAL | Status: DC | PRN

## 2012-02-27 MED ORDER — INFLUENZA VIRUS VACC SPLIT PF IM SUSP
0.5000 mL | INTRAMUSCULAR | Status: DC
  Filled 2012-02-27: qty 0.5

## 2012-02-27 MED ORDER — SODIUM CHLORIDE 0.9 % IJ SOLN
3.0000 mL | Freq: Two times a day (BID) | INTRAMUSCULAR | Status: DC
  Administered 2012-02-27: 21:00:00 3 mL via INTRAVENOUS

## 2012-02-27 NOTE — H&P (View-Only) (Signed)
Lauren Norton is a pleasant 66 y.o. WF patient with a h/o HTN and borderline diabetes who presents today for EP follow-up regarding second degree AV block.  Since last being seen in our office, she reports doing well. She has resumed normal activity.  She become more symptomatic with AV block.  She reports symptoms of fatigue and occasional dizziness.  She reports decreased exercise tolerance and finds that with activity her heart rate is frequently 40s.  She has had two episodes of transient LOC (for only several seconds). Today, she denies symptoms of palpitations, chest pain, shortness of breath, or lower extremity edema. The patient is tolerating medications without difficulties and is otherwise without complaint today.   Past Medical History  Diagnosis Date  . HLD (hyperlipidemia)   . HTN (hypertension)   . Second degree AV block     2:1 narrow complex QRS  . Glucose intolerance (impaired glucose tolerance)    Past Surgical History  Procedure Date  . Total hip arthroplasty     bilateral    Current Outpatient Prescriptions  Medication Sig Dispense Refill  . atorvastatin (LIPITOR) 20 MG tablet Take 20 mg by mouth daily.        . cetirizine (ZYRTEC) 10 MG tablet Take 5 mg by mouth as needed.       Marland Kitchen lisinopril (PRINIVIL,ZESTRIL) 5 MG tablet Take 0.5 tablets (2.5 mg total) by mouth daily.  30 tablet  11    Allergies  Allergen Reactions  . Sulfa Antibiotics     History   Social History  . Marital Status: Married    Spouse Name: N/A    Number of Children: 3  . Years of Education: N/A   Occupational History  . retired    Social History Main Topics  . Smoking status: Never Smoker   . Smokeless tobacco: Not on file  . Alcohol Use: 1.0 - 1.5 oz/week    2-3 drink(s) per week     wine or mixed drink  . Drug Use: No  . Sexually Active: Not on file   Other Topics Concern  . Not on file   Social History Narrative   Pt lives in [place].  Retired.    Family  History  Problem Relation Age of Onset  . Heart attack    . Heart disease    . Diabetes      Physical Exam: Filed Vitals:   02/19/12 1044  BP: 149/76  Pulse: 47  Height: 5' 7.5" (1.715 m)  Weight: 147 lb (66.679 kg)    GEN- The patient is well appearing, alert and oriented x 3 today.   Head- normocephalic, atraumatic Eyes-  Sclera clear, conjunctiva pink Ears- hearing intact Oropharynx- clear Neck- supple, no JVP Lymph- no cervical lymphadenopathy Lungs- Clear to ausculation bilaterally, normal work of breathing Heart- Regular rate and rhythm, no murmurs, rubs or gallops, PMI not laterally displaced GI- soft, NT, ND, + BS Extremities- no clubbing, cyanosis,  MS- mild swelling in R ankle Skin- no rash or lesion Psych- euthymic mood, full affect Neuro- strength and sensation are intact  EKG- today reveals sinus rhythm 72bpm, 1:1 AV conduction, incomplete RBBB  Echo 7/12 reviewed  Holter monitor 8/13 reviewed  Assessment and Plan:

## 2012-02-27 NOTE — Interval H&P Note (Signed)
History and Physical Interval Note:  02/27/2012 2:09 PM  Lauren Norton  has presented today for surgery, with the diagnosis of 2nd degree heart block  The various methods of treatment have been discussed with the patient and family. After consideration of risks, benefits and other options for treatment, the patient has consented to  Procedure(s) (LRB) with comments: PERMANENT PACEMAKER INSERTION (N/A) as a surgical intervention .  The patient's history has been reviewed, patient examined, no change in status, stable for surgery.  I have reviewed the patient's chart and labs.  Questions were answered to the patient's satisfaction.     Hillis Range

## 2012-02-27 NOTE — Op Note (Signed)
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 Left upper extremity venography.   2. Pacemaker implantation.     INTRODUCTION: Lauren Norton is a 66 y.o. female  with a history of symptomatic mobitz II second degree AV block who presents today for pacemaker implantation.  The patient reports intermittent episodes of dizziness/ presyncope over the past few months.  No reversible causes have been identified.  The patient therefore presents today for pacemaker implantation.     DESCRIPTION OF PROCEDURE:  Informed written consent was obtained, and the patient was brought to the electrophysiology lab in a fasting state.  The patient received IV Versed and Fentanyl as sedation for the procedure today.  The patients left chest was prepped and draped in the usual sterile fashion by the EP lab staff. The skin overlying the left deltopectoral region was infiltrated with lidocaine for local analgesia.  A 4-cm incision was made over the left deltopectoral region.  A left subcutaneous pacemaker pocket was fashioned using a combination of sharp and blunt dissection. Electrocautery was required to assure hemostasis.    Left Upper Extremity Venography: A venogram of the left upper extremity was performed, which revealed a large left cephalic vein, which emptied into a large left subclavian vein.  The left axillary vein was moderate in size.    RA/RV Lead Placement: The left axillary vein was therefore cannulated.  Through the left axillary vein, a Public house manager model [phone] (serial number  [id number]) right atrial lead and a [facility] Jude Medical model 1948- 58 (serial number [id number]) right ventricular lead were advanced with fluoroscopic visualization into the right atrial appendage and right ventricular apex positions respectively.  Initial atrial lead P- waves  measured 2.8 mV with impedance of 538 ohms and a threshold of 0.4 V at 0.5 msec.  Right ventricular lead R-waves measured 11 mV with an impedance of 702 ohms and a threshold of 0.4 V at 0.5 msec.  Both leads were secured to the pectoralis fascia using #2-0 silk over the suture sleeves.   Device Placement:  The leads were then connected to a [employer] Acc[employer] DR RF model [id number] (serial number [id number] ) pacemaker.  The pocket was irrigated with copious gentamicin solution.  The pacemaker was then placed into the pocket.  The pocket was then closed in 2 layers with 2.0 Vicryl suture for the subcutaneous and subcuticular layers.  Steri-   Strips and a sterile dressing were then applied.  There were no early apparent complications.     CONCLUSIONS:   1. Successful implantation of a Industrial/product designer DR RF dual-chamber pacemaker for symptomatic mobitz II second degree AV block   2. No early apparent complications.           Hillis Range, MD 02/27/2012 3:42 PM

## 2012-02-27 NOTE — Plan of Care (Signed)
Problem: Phase III Progression Outcomes Goal: Discharge plan remains appropriate-arrangements made Outcome: Completed/Met Date Met:  02/27/12 Plan to discharge home with husband.

## 2012-02-28 ENCOUNTER — Encounter: Payer: Self-pay | Admitting: *Deleted

## 2012-02-28 ENCOUNTER — Ambulatory Visit (HOSPITAL_COMMUNITY): Payer: Medicare Other

## 2012-02-28 DIAGNOSIS — I441 Atrioventricular block, second degree: Secondary | ICD-10-CM

## 2012-02-28 DIAGNOSIS — I10 Essential (primary) hypertension: Secondary | ICD-10-CM | POA: Diagnosis not present

## 2012-02-28 DIAGNOSIS — E785 Hyperlipidemia, unspecified: Secondary | ICD-10-CM | POA: Diagnosis not present

## 2012-02-28 DIAGNOSIS — Z950 Presence of cardiac pacemaker: Secondary | ICD-10-CM | POA: Insufficient documentation

## 2012-02-28 DIAGNOSIS — M25519 Pain in unspecified shoulder: Secondary | ICD-10-CM | POA: Diagnosis not present

## 2012-02-28 DIAGNOSIS — Z01812 Encounter for preprocedural laboratory examination: Secondary | ICD-10-CM | POA: Diagnosis not present

## 2012-02-28 NOTE — Discharge Summary (Signed)
ELECTROPHYSIOLOGY DISCHARGE SUMMARY    Patient ID: Lauren Norton,  MRN: [id number], DOB/AGE: August 14, 1945 66 y.o.  Admit date: 02/27/2012 Discharge date: 02/28/2012  Primary Care Physician: Chilton Greathouse, MD Primary Cardiologist:   Primary Discharge Diagnosis:  1. Second degree AV block, Mobitz II, s/p PPM implantation  Secondary Discharge Diagnoses:  1. HTN 2. Dyslipidemia  Procedures This Admission:  1. Dual chamber PPM implantation 02/27/2012 [facility] model [phone] (serial number [id number]) right atrial lead and a [employer] model 1948- 58 (serial number [id number]) right ventricular lead were advanced with fluoroscopic visualization into the right atrial appendage and right ventricular apex positions respectively. Initial atrial lead P- waves measured 2.8 mV with impedance of 538 ohms and a threshold of 0.4 V at 0.5 msec. Right ventricular lead R-waves measured 11 mV with an impedance of 702 ohms and a threshold of 0.4 V at 0.5 msec. [address] Jude Medical Accent DR RF model [id number] (serial number [id number] ) pacemaker.  History and Hospital Course:  Lauren Norton is a 66 year old woman with second degree AV block, Mobitz II, who presented yesterday for PPM implantation. Details of the procedure as outlined above. The patient tolerated this procedure well without any immediate complication. She remains hemodynamically stable and afebrile. Her chest xray shows stable lead placement without pneumothorax. Her device interrogation this AM has been reviewed by Dr. Johney Frame and shows normal PPM function with stable lead parameters/measurements. Her implant site is intact without significant bleeding or hematoma. She has been given discharge instructions including wound care and activity restrictions. She will follow-up in 10 days for wound check. There were no changes made to her medications. She has been seen, examined and deemed stable for discharge today by Dr. Hillis Range.  Discharge  Vitals: Blood pressure 112/54, pulse 64, temperature 97.7 F (36.5 C), temperature source Oral, resp. rate 21, height 5' 7.5" (1.715 m), weight 146 lb 13.2 oz (66.6 kg), SpO2 98.00%.   Labs: None in last 48 hours  Disposition:  The patient is being discharged in stable condition.  Follow-up: Follow-up Information    Follow up with [facility] On 03/11/2012. (At 3:30 PM for wound check)    Contact information:   [address]  [address] [place] [address] [phone]      Follow up with Hillis Range, MD. On 06/07/2012. (At 9:30 AM)    Contact information:   [address]  [address] [place] [address] [phone]   Discharge Medications:    Medication List     As of 02/28/2012  8:57 AM    TAKE these medications         atorvastatin 20 MG tablet   Commonly known as: LIPITOR   Take 20 mg by mouth daily.      calcium citrate-vitamin D 200-200 MG-UNIT Tabs   Take 1 tablet by mouth 2 (two) times daily.      CINNAMON PO   Take 2 tablets by mouth daily.      Co Q-10 100 MG Caps   Take 100 mg by mouth daily.      lisinopril 5 MG tablet   Commonly known as: PRINIVIL,ZESTRIL   Take 5 mg by mouth daily.      multivitamin with minerals Tabs   Take 1 tablet by mouth daily.      vitamin B-12 1000 MCG tablet   Commonly known as: CYANOCOBALAMIN   Take 1,000 mcg by mouth daily.  vitamin C 500 MG tablet   Commonly known as: ASCORBIC ACID   Take 500 mg by mouth daily.      Duration of Discharge Encounter: Greater than 30 minutes including physician time.  Limmie Patricia, PA-C 02/28/2012, 8:57 AM  Hillis Range, MD

## 2012-02-28 NOTE — Progress Notes (Signed)
   SUBJECTIVE: The patient is doing well today.  At this time, she denies chest pain, shortness of breath, or any new concerns.     Lauren Norton  ceFAZolin (ANCEF) IV  1 g Intravenous Q6H  . fentaNYL      . influenza  inactive virus vaccine  0.5 mL Intramuscular Tomorrow-1000  . lidocaine      . lidocaine      . lisinopril  5 mg Oral Daily  . midazolam      . pneumococcal 23 valent vaccine  0.5 mL Intramuscular Tomorrow-1000  . sodium chloride  3 mL Intravenous Q12H  . you have a pacemaker book   Does not apply Once  . DISCONTD:  ceFAZolin (ANCEF) IV  2 g Intravenous On Call  . DISCONTD: chlorhexidine  60 mL Topical Once  . DISCONTD: gentamicin irrigation  80 mg Irrigation On Call  . DISCONTD: mupirocin ointment   Nasal BID  . DISCONTD: sodium chloride  3 mL Intravenous Q12H      . DISCONTD: sodium chloride 50 mL/hr at 02/27/12 1326  . DISCONTD: sodium chloride      OBJECTIVE: Physical Exam: Filed Vitals:   02/28/12 0041 02/28/12 0411 02/28/12 0529 02/28/12 0816  BP: 102/66 121/79  112/54  Pulse: 67 64 71 64  Temp: 97.6 F (36.4 C) 97.7 F (36.5 C)  97.7 F (36.5 C)  TempSrc: Oral Oral  Oral  Resp: 11 8 13 21   Height:      Weight: 146 lb 13.2 oz (66.6 kg)     SpO2: 98% 98%  98%    Intake/Output Summary (Last 24 hours) at 02/28/12 0844 Last data filed at 02/28/12 0600  Gross per 24 hour  Intake    560 ml  Output      0 ml  Net    560 ml    Telemetry reveals atrial pacing  GEN- The patient is well appearing, alert and oriented x 3 today.   Head- normocephalic, atraumatic Eyes-  Sclera clear, conjunctiva pink Ears- hearing intact Oropharynx- clear Neck- supple, no JVP Lymph- no cervical lymphadenopathy Lungs- Clear to ausculation bilaterally, normal work of breathing Heart- Regular rate and rhythm, no murmurs, rubs or gallops, PMI not laterally displaced GI- soft, NT, ND, + BS Extremities- no clubbing, cyanosis, or edema Skin- pacemaker pocket with mild ecchymosis  but no hematoma  CXR- reveals no ptx, stable leads   Device interrogation is reviewed and is normal today   ASSESSMENT AND PLAN:  Active Problems:  Second degree AV block  Hypertension   1. Second degree AV block Doing well s/p PPM Routine wound care and follow-up  DC to home today   Dual chamber pacemaker interrogation is reviewed and is normal this am (see paper chart)   Hillis Range, MD 02/28/2012 8:44 AM

## 2012-03-11 ENCOUNTER — Encounter: Payer: Self-pay | Admitting: Internal Medicine

## 2012-03-11 ENCOUNTER — Ambulatory Visit (INDEPENDENT_AMBULATORY_CARE_PROVIDER_SITE_OTHER): Payer: Medicare Other | Admitting: *Deleted

## 2012-03-11 DIAGNOSIS — I441 Atrioventricular block, second degree: Secondary | ICD-10-CM

## 2012-03-11 LAB — PACEMAKER DEVICE OBSERVATION
AL IMPEDENCE PM: 637.5 Ohm
AL THRESHOLD: 0.375 V
ATRIAL PACING PM: 35
BATTERY VOLTAGE: 3.008 V
RV LEAD IMPEDENCE PM: 775 Ohm

## 2012-03-11 NOTE — Progress Notes (Signed)
Wound check-PPM 

## 2012-03-12 ENCOUNTER — Telehealth: Payer: Self-pay | Admitting: Internal Medicine

## 2012-03-12 NOTE — Telephone Encounter (Signed)
Spoke w/pt in regards in to question about weight limit. Instructed pt it was 10 pounds with left arm. Pt understands.

## 2012-03-12 NOTE — Telephone Encounter (Signed)
New Problem:    Patient called in wanting to know what the weight limit was for her left side.  Was unsure if it was 5 or 10 pounds.  Please call back, feel free to leave a message.

## 2012-03-29 DIAGNOSIS — E785 Hyperlipidemia, unspecified: Secondary | ICD-10-CM | POA: Diagnosis not present

## 2012-03-29 DIAGNOSIS — R7301 Impaired fasting glucose: Secondary | ICD-10-CM | POA: Diagnosis not present

## 2012-03-29 DIAGNOSIS — I10 Essential (primary) hypertension: Secondary | ICD-10-CM | POA: Diagnosis not present

## 2012-04-08 DIAGNOSIS — I10 Essential (primary) hypertension: Secondary | ICD-10-CM | POA: Diagnosis not present

## 2012-04-08 DIAGNOSIS — Z0000 Encounter for general adult medical examination without abnormal findings: Secondary | ICD-10-CM | POA: Diagnosis not present

## 2012-04-08 DIAGNOSIS — Z23 Encounter for immunization: Secondary | ICD-10-CM | POA: Diagnosis not present

## 2012-04-08 DIAGNOSIS — Z1331 Encounter for screening for depression: Secondary | ICD-10-CM | POA: Diagnosis not present

## 2012-04-08 DIAGNOSIS — E119 Type 2 diabetes mellitus without complications: Secondary | ICD-10-CM | POA: Diagnosis not present

## 2012-04-08 DIAGNOSIS — E785 Hyperlipidemia, unspecified: Secondary | ICD-10-CM | POA: Diagnosis not present

## 2012-04-12 DIAGNOSIS — Z1212 Encounter for screening for malignant neoplasm of rectum: Secondary | ICD-10-CM | POA: Diagnosis not present

## 2012-04-23 DIAGNOSIS — D2370 Other benign neoplasm of skin of unspecified lower limb, including hip: Secondary | ICD-10-CM | POA: Diagnosis not present

## 2012-04-23 DIAGNOSIS — L723 Sebaceous cyst: Secondary | ICD-10-CM | POA: Diagnosis not present

## 2012-04-23 DIAGNOSIS — D1801 Hemangioma of skin and subcutaneous tissue: Secondary | ICD-10-CM | POA: Diagnosis not present

## 2012-04-23 DIAGNOSIS — D2360 Other benign neoplasm of skin of unspecified upper limb, including shoulder: Secondary | ICD-10-CM | POA: Diagnosis not present

## 2012-04-23 DIAGNOSIS — L821 Other seborrheic keratosis: Secondary | ICD-10-CM | POA: Diagnosis not present

## 2012-04-23 DIAGNOSIS — L253 Unspecified contact dermatitis due to other chemical products: Secondary | ICD-10-CM | POA: Diagnosis not present

## 2012-04-23 DIAGNOSIS — D239 Other benign neoplasm of skin, unspecified: Secondary | ICD-10-CM | POA: Diagnosis not present

## 2012-04-24 DIAGNOSIS — Z1231 Encounter for screening mammogram for malignant neoplasm of breast: Secondary | ICD-10-CM | POA: Diagnosis not present

## 2012-06-07 ENCOUNTER — Ambulatory Visit (INDEPENDENT_AMBULATORY_CARE_PROVIDER_SITE_OTHER): Payer: Medicare Other | Admitting: Internal Medicine

## 2012-06-07 ENCOUNTER — Encounter: Payer: Self-pay | Admitting: Internal Medicine

## 2012-06-07 VITALS — BP 133/73 | HR 80 | Ht 67.0 in | Wt 146.0 lb

## 2012-06-07 DIAGNOSIS — I441 Atrioventricular block, second degree: Secondary | ICD-10-CM | POA: Diagnosis not present

## 2012-06-07 DIAGNOSIS — I10 Essential (primary) hypertension: Secondary | ICD-10-CM

## 2012-06-07 DIAGNOSIS — Z950 Presence of cardiac pacemaker: Secondary | ICD-10-CM

## 2012-06-07 LAB — PACEMAKER DEVICE OBSERVATION
ATRIAL PACING PM: 31
BAMS-0001: 150 {beats}/min
BAMS-0003: 70 {beats}/min
DEVICE MODEL PM: 7387861
RV LEAD THRESHOLD: 0.5 V

## 2012-06-07 NOTE — Progress Notes (Signed)
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 female who presents today for routine electrophysiology followup.  Since having her pacemaker implanted, the patient reports doing very well.   Her exercise tolerance is much improved. Today, she denies symptoms of palpitations, chest pain, shortness of breath,  lower extremity edema, dizziness, presyncope, or syncope.  The patient is otherwise without complaint today.   Past Medical History  Diagnosis Date  . HLD (hyperlipidemia)   . HTN (hypertension)   . Second degree AV block     2:1 narrow complex QRS  . Glucose intolerance (impaired glucose tolerance)   . Pacemaker   . Pneumonia 1970's  . Exertional dyspnea 02/27/2012    "when I tried to go hiking"  . Osteoarthritis of both hips   . Skin cancer ~ 2007    "back"   Past Surgical History  Procedure Date  . Total hip arthroplasty 2001; 2012    right; left  . Pacemaker insertion 02/27/2012    SJM Accent DR RF implanted by Dr Johney Frame for second degree AV block  . Tonsillectomy and adenoidectomy ~ 1960  . Skin cancer excision ~ 2007    Current Outpatient Prescriptions  Medication Sig Dispense Refill  . atorvastatin (LIPITOR) 20 MG tablet Take 20 mg by mouth daily.        . calcium citrate-vitamin D 200-200 MG-UNIT TABS Take 1 tablet by mouth 2 (two) times daily.       Marland Kitchen CINNAMON PO Take 2 tablets by mouth daily.      . Coenzyme Q10 (CO Q-10) 100 MG CAPS Take 100 mg by mouth daily.      Marland Kitchen glucosamine-chondroitin 500-400 MG tablet Two tabs daily      . lisinopril (PRINIVIL,ZESTRIL) 5 MG tablet Take 10 mg by mouth daily.       . Multiple Vitamin (MULTIVITAMIN WITH MINERALS) TABS Take 1 tablet by mouth daily.      . vitamin B-12 (CYANOCOBALAMIN) 1000 MCG tablet Take 1,000 mcg by mouth daily.      . vitamin C (ASCORBIC ACID) 500 MG tablet Take 500 mg by mouth daily.        Physical Exam: Filed Vitals:   06/07/12 0941  BP: 133/73  Pulse:  80  Height: 5\' 7"  (1.702 m)  Weight: 146 lb (66.225 kg)    GEN- The patient is well appearing, alert and oriented x 3 today.   Head- normocephalic, atraumatic Eyes-  Sclera clear, conjunctiva pink Ears- hearing intact Oropharynx- clear Lungs- Clear to ausculation bilaterally, normal work of breathing Chest- pacemaker pocket is well healed Heart- Regular rate and rhythm, no murmurs, rubs or gallops, PMI not laterally displaced GI- soft, NT, ND, + BS Extremities- no clubbing, cyanosis, or edema  Pacemaker interrogation- reviewed in detail today,  See PACEART report  Assessment and Plan:  1. Second degree AV block Normal pacemaker function See Pace Art report  2. HTN Stable No change required today

## 2012-06-07 NOTE — Patient Instructions (Signed)
Your physician wants you to follow-up in: 9 months with Dr Allred You will receive a reminder letter in the mail two months in advance. If you don't receive a letter, please call our office to schedule the follow-up appointment.  

## 2012-09-02 DIAGNOSIS — G4700 Insomnia, unspecified: Secondary | ICD-10-CM | POA: Diagnosis not present

## 2012-09-02 DIAGNOSIS — E119 Type 2 diabetes mellitus without complications: Secondary | ICD-10-CM | POA: Diagnosis not present

## 2012-09-02 DIAGNOSIS — I10 Essential (primary) hypertension: Secondary | ICD-10-CM | POA: Diagnosis not present

## 2012-09-02 DIAGNOSIS — J309 Allergic rhinitis, unspecified: Secondary | ICD-10-CM | POA: Diagnosis not present

## 2012-09-02 DIAGNOSIS — K5900 Constipation, unspecified: Secondary | ICD-10-CM | POA: Diagnosis not present

## 2012-09-26 IMAGING — CR DG HIP (WITH OR WITHOUT PELVIS) 2-3V*L*
3 series · 3 of 3 positions shown · non-contrast
Comparison: None

CLINICAL DATA: Preop left hip arthroplasty

LEFT HIP - COMPLETE 2+ VIEW

[t pelvis a.p.]
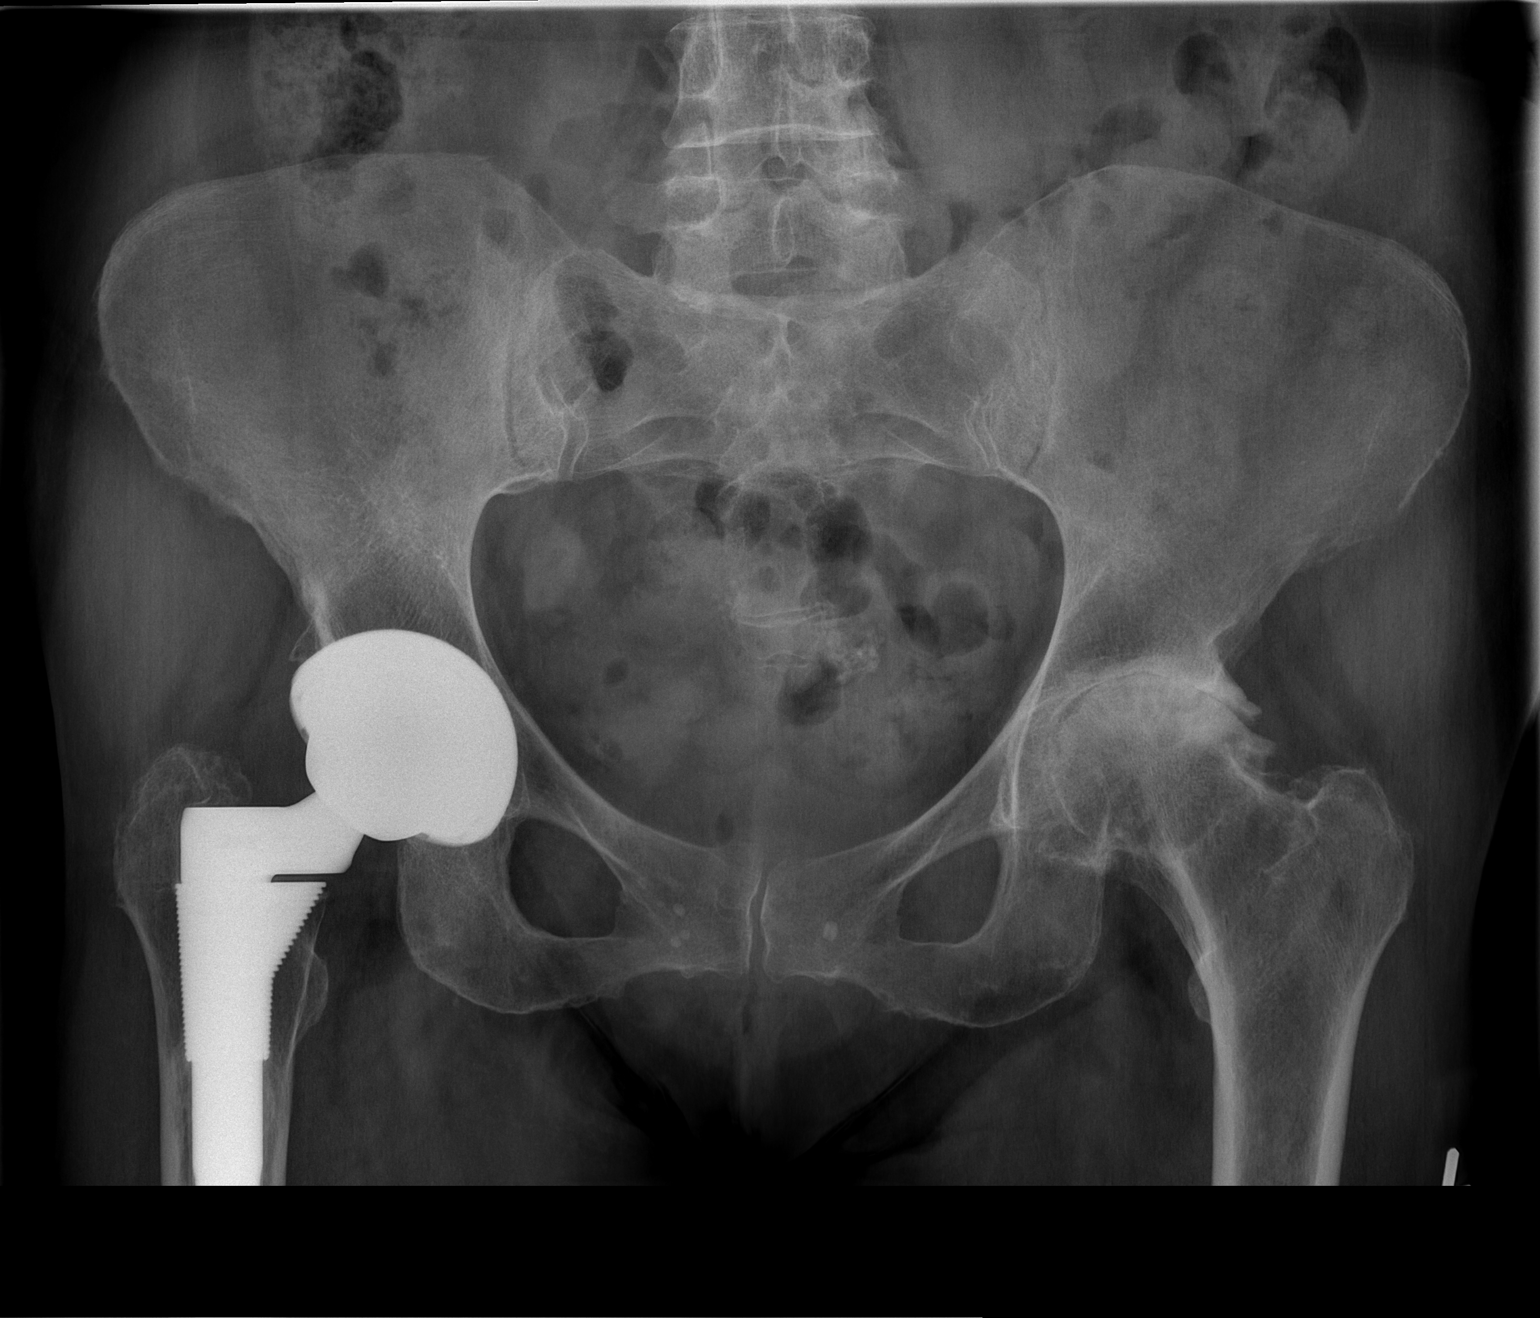

[t hip ap left]
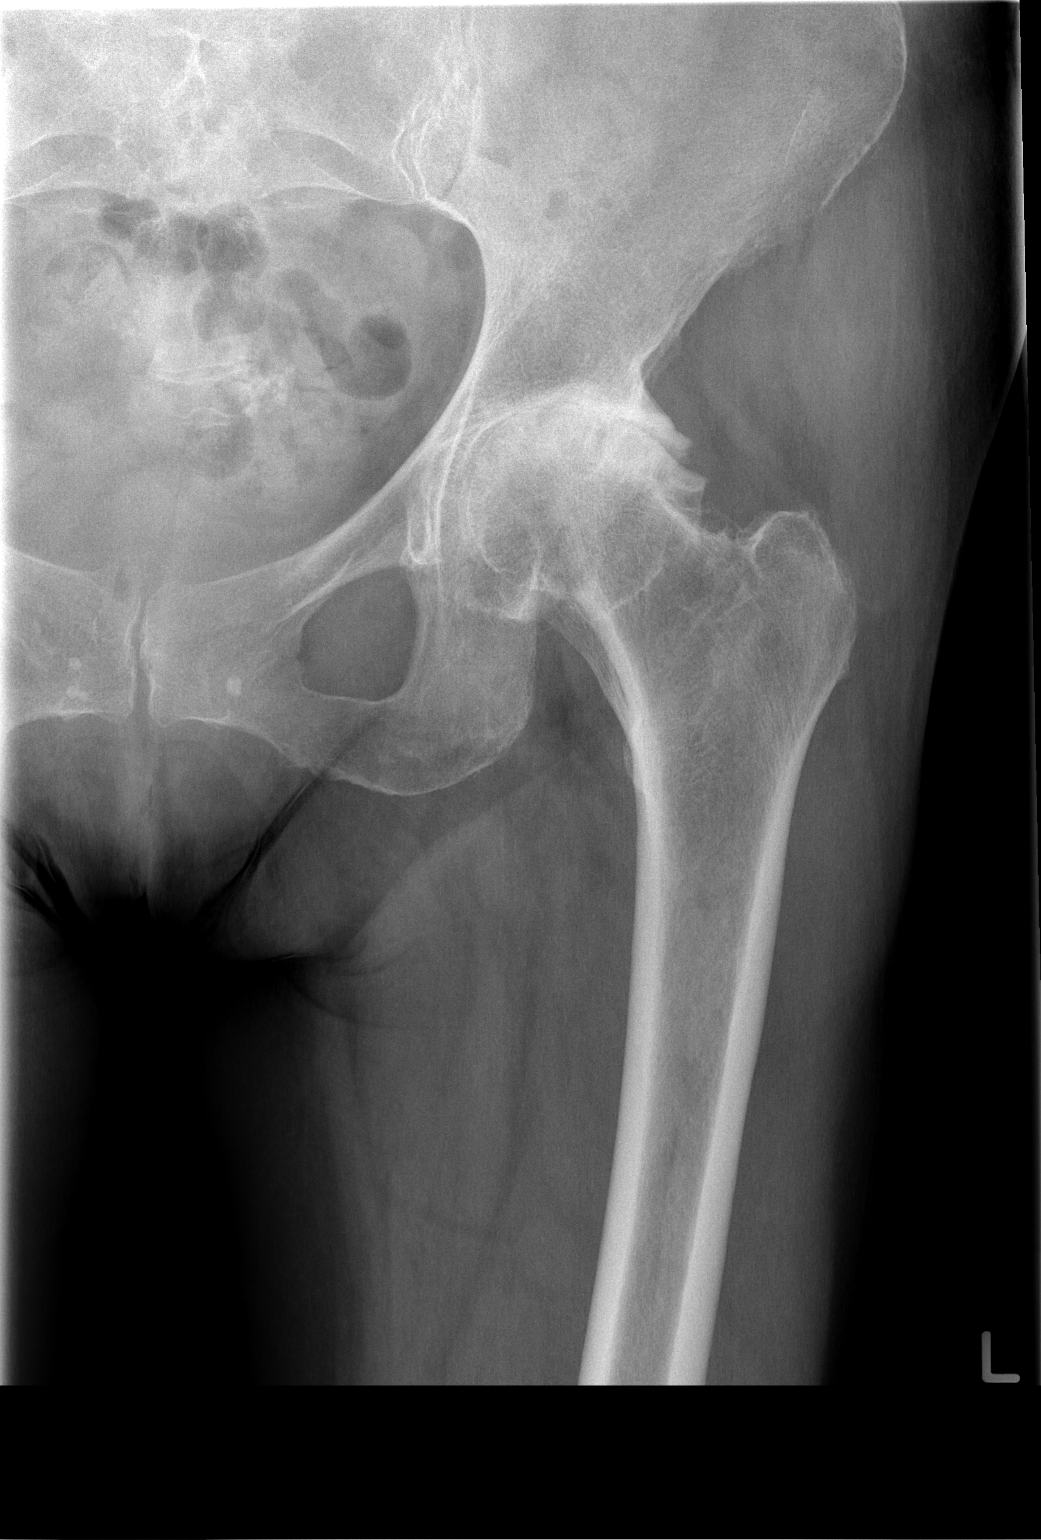

[t hip frog leg left]
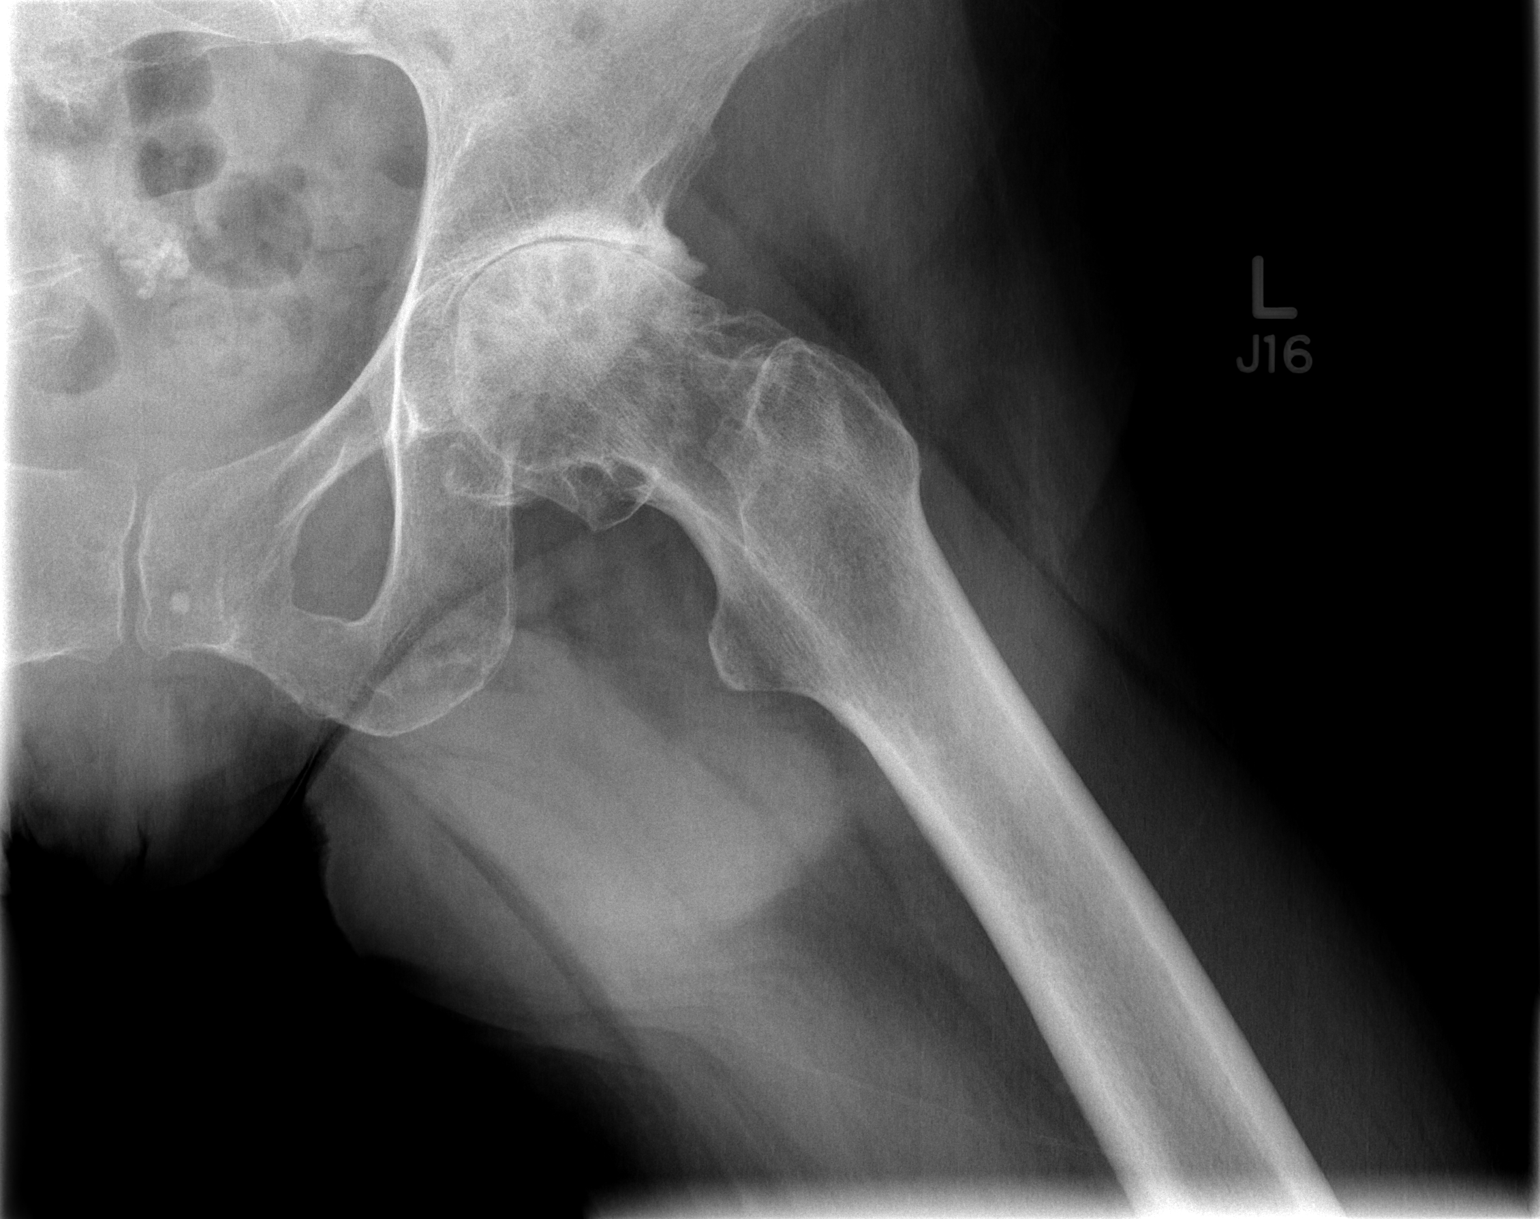

[3 of 3 positions shown; findings below may reference images not displayed]

FINDINGS: The patient is status post prior right total hip arthroplasty.

There is severe degenerative joint disease involving the left hip.

Changes include marginal spur formation subchondral cyst formation
and subchondral sclerosis.

No acute fractures or dislocations.
IMPRESSION: 1.  Severe left hip osteoarthritis.
2.  Prior right hip arthroplasty.

## 2013-01-29 DIAGNOSIS — L821 Other seborrheic keratosis: Secondary | ICD-10-CM | POA: Diagnosis not present

## 2013-01-29 DIAGNOSIS — L739 Follicular disorder, unspecified: Secondary | ICD-10-CM | POA: Diagnosis not present

## 2013-01-29 DIAGNOSIS — D1801 Hemangioma of skin and subcutaneous tissue: Secondary | ICD-10-CM | POA: Diagnosis not present

## 2013-01-29 DIAGNOSIS — D2330 Other benign neoplasm of skin of unspecified part of face: Secondary | ICD-10-CM | POA: Diagnosis not present

## 2013-01-29 DIAGNOSIS — L723 Sebaceous cyst: Secondary | ICD-10-CM | POA: Diagnosis not present

## 2013-01-29 DIAGNOSIS — I789 Disease of capillaries, unspecified: Secondary | ICD-10-CM | POA: Diagnosis not present

## 2013-01-29 DIAGNOSIS — L819 Disorder of pigmentation, unspecified: Secondary | ICD-10-CM | POA: Diagnosis not present

## 2013-01-29 DIAGNOSIS — D2370 Other benign neoplasm of skin of unspecified lower limb, including hip: Secondary | ICD-10-CM | POA: Diagnosis not present

## 2013-02-07 DIAGNOSIS — H2510 Age-related nuclear cataract, unspecified eye: Secondary | ICD-10-CM | POA: Diagnosis not present

## 2013-02-07 DIAGNOSIS — H25049 Posterior subcapsular polar age-related cataract, unspecified eye: Secondary | ICD-10-CM | POA: Diagnosis not present

## 2013-02-07 DIAGNOSIS — E119 Type 2 diabetes mellitus without complications: Secondary | ICD-10-CM | POA: Diagnosis not present

## 2013-02-18 DIAGNOSIS — R300 Dysuria: Secondary | ICD-10-CM | POA: Diagnosis not present

## 2013-03-06 ENCOUNTER — Ambulatory Visit (INDEPENDENT_AMBULATORY_CARE_PROVIDER_SITE_OTHER): Payer: Medicare Other | Admitting: Internal Medicine

## 2013-03-06 ENCOUNTER — Encounter: Payer: Self-pay | Admitting: Internal Medicine

## 2013-03-06 VITALS — BP 142/80 | HR 70 | Ht 67.0 in | Wt 150.2 lb

## 2013-03-06 DIAGNOSIS — I441 Atrioventricular block, second degree: Secondary | ICD-10-CM | POA: Diagnosis not present

## 2013-03-06 DIAGNOSIS — I10 Essential (primary) hypertension: Secondary | ICD-10-CM

## 2013-03-06 DIAGNOSIS — Z950 Presence of cardiac pacemaker: Secondary | ICD-10-CM | POA: Diagnosis not present

## 2013-03-06 LAB — PACEMAKER DEVICE OBSERVATION
AL AMPLITUDE: 3.7 mv
AL IMPEDENCE PM: 600 Ohm
AL THRESHOLD: 0.375 V
DEVICE MODEL PM: 7387861
RV LEAD IMPEDENCE PM: 650 Ohm
RV LEAD THRESHOLD: 0.625 V

## 2013-03-06 NOTE — Patient Instructions (Addendum)
Your physician wants you to follow-up in: 12 months with Dr Allred You will receive a reminder letter in the mail two months in advance. If you don't receive a letter, please call our office to schedule the follow-up appointment.  Remote monitoring is used to monitor your Pacemaker or ICD from home. This monitoring reduces the number of office visits required to check your device to one time per year. It allows us to keep an eye on the functioning of your device to ensure it is working properly. You are scheduled for a device check from home on 06/09/13. You may send your transmission at any time that day. If you have a wireless device, the transmission will be sent automatically. After your physician reviews your transmission, you will receive a postcard with your next transmission date.    

## 2013-03-06 NOTE — Progress Notes (Signed)
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  Since having her pacemaker implanted, the patient reports doing very well. Today, she denies symptoms of palpitations, chest pain, shortness of breath,  lower extremity edema, dizziness, presyncope, or syncope.  The patient is otherwise without complaint today.   Past Medical History  Diagnosis Date  . HLD (hyperlipidemia)   . HTN (hypertension)   . Second degree AV block     2:1 narrow complex QRS  . Glucose intolerance (impaired glucose tolerance)   . Pacemaker   . Pneumonia 1970's  . Exertional dyspnea 02/27/2012    "when I tried to go hiking"  . Osteoarthritis of both hips   . Skin cancer ~ 2007    "back"   Past Surgical History  Procedure Laterality Date  . Total hip arthroplasty  2001; 2012    right; left  . Pacemaker insertion  02/27/2012    SJM Accent DR RF implanted by Dr Johney Frame for second degree AV block  . Tonsillectomy and adenoidectomy  ~ 1960  . Skin cancer excision  ~ 2007    Current Outpatient Prescriptions  Medication Sig Dispense Refill  . atorvastatin (LIPITOR) 20 MG tablet Take 20 mg by mouth daily.        . calcium citrate-vitamin D 200-200 MG-UNIT TABS Take 1 tablet by mouth 2 (two) times daily.       . Coenzyme Q10 (CO Q-10) 100 MG CAPS Take 100 mg by mouth daily.      Marland Kitchen glucosamine-chondroitin 500-400 MG tablet Two tabs daily      . lisinopril (PRINIVIL,ZESTRIL) 5 MG tablet Take 10 mg by mouth daily.       . Multiple Vitamin (MULTIVITAMIN WITH MINERALS) TABS Take 1 tablet by mouth daily.      . vitamin C (ASCORBIC ACID) 500 MG tablet Take 500 mg by mouth daily.      Marland Kitchen CINNAMON PO Take 2 tablets by mouth daily.       No current facility-administered medications for this visit.    Physical Exam: Filed Vitals:   03/06/13 1515  BP: 142/80  Pulse: 70  Height: 5\' 7"  (1.702 m)  Weight: 150 lb 4  oz (68.153 kg)    GEN- The patient is well appearing, alert and oriented x 3 today.   Head- normocephalic, atraumatic Eyes-  Sclera clear, conjunctiva pink Ears- hearing intact Oropharynx- clear Lungs- Clear to ausculation bilaterally, normal work of breathing Chest- pacemaker pocket is well healed Heart- Regular rate and rhythm, no murmurs, rubs or gallops, PMI not laterally displaced GI- soft, NT, ND, + BS Extremities- no clubbing, cyanosis, or edema  Pacemaker interrogation- reviewed in detail today,  See PACEART report  Assessment and Plan:  1. Second degree AV block Normal pacemaker function See Pace Art report  2. HTN Stable No change required today  3. HL Stable No change required today  Merlin Return in 1 year

## 2013-03-07 ENCOUNTER — Encounter: Payer: Self-pay | Admitting: Internal Medicine

## 2013-04-03 DIAGNOSIS — E785 Hyperlipidemia, unspecified: Secondary | ICD-10-CM | POA: Diagnosis not present

## 2013-04-03 DIAGNOSIS — E1169 Type 2 diabetes mellitus with other specified complication: Secondary | ICD-10-CM | POA: Diagnosis not present

## 2013-04-03 DIAGNOSIS — I10 Essential (primary) hypertension: Secondary | ICD-10-CM | POA: Diagnosis not present

## 2013-04-03 DIAGNOSIS — R809 Proteinuria, unspecified: Secondary | ICD-10-CM | POA: Diagnosis not present

## 2013-04-10 DIAGNOSIS — Z1331 Encounter for screening for depression: Secondary | ICD-10-CM | POA: Diagnosis not present

## 2013-04-10 DIAGNOSIS — E1169 Type 2 diabetes mellitus with other specified complication: Secondary | ICD-10-CM | POA: Diagnosis not present

## 2013-04-10 DIAGNOSIS — J309 Allergic rhinitis, unspecified: Secondary | ICD-10-CM | POA: Diagnosis not present

## 2013-04-10 DIAGNOSIS — E785 Hyperlipidemia, unspecified: Secondary | ICD-10-CM | POA: Diagnosis not present

## 2013-04-10 DIAGNOSIS — Z23 Encounter for immunization: Secondary | ICD-10-CM | POA: Diagnosis not present

## 2013-04-10 DIAGNOSIS — M1990 Unspecified osteoarthritis, unspecified site: Secondary | ICD-10-CM | POA: Diagnosis not present

## 2013-04-10 DIAGNOSIS — Z0000 Encounter for general adult medical examination without abnormal findings: Secondary | ICD-10-CM | POA: Diagnosis not present

## 2013-04-10 DIAGNOSIS — I10 Essential (primary) hypertension: Secondary | ICD-10-CM | POA: Diagnosis not present

## 2013-04-10 DIAGNOSIS — G4700 Insomnia, unspecified: Secondary | ICD-10-CM | POA: Diagnosis not present

## 2013-04-10 DIAGNOSIS — I459 Conduction disorder, unspecified: Secondary | ICD-10-CM | POA: Diagnosis not present

## 2013-04-11 DIAGNOSIS — Z1212 Encounter for screening for malignant neoplasm of rectum: Secondary | ICD-10-CM | POA: Diagnosis not present

## 2013-05-09 ENCOUNTER — Encounter: Payer: Medicare Other | Admitting: Internal Medicine

## 2013-06-09 ENCOUNTER — Ambulatory Visit (INDEPENDENT_AMBULATORY_CARE_PROVIDER_SITE_OTHER): Payer: Medicare Other | Admitting: *Deleted

## 2013-06-09 ENCOUNTER — Telehealth: Payer: Self-pay | Admitting: Internal Medicine

## 2013-06-09 ENCOUNTER — Encounter: Payer: Self-pay | Admitting: Internal Medicine

## 2013-06-09 DIAGNOSIS — I441 Atrioventricular block, second degree: Secondary | ICD-10-CM | POA: Diagnosis not present

## 2013-06-09 LAB — MDC_IDC_ENUM_SESS_TYPE_REMOTE
Battery Voltage: 2.96 V
Brady Statistic AP VP Percent: 8.4 %
Brady Statistic AP VS Percent: 17 %
Brady Statistic AS VP Percent: 32 %
Brady Statistic RA Percent Paced: 26 %
Implantable Pulse Generator Model: 2210
Implantable Pulse Generator Serial Number: 7387861
Lead Channel Impedance Value: 690 Ohm
Lead Channel Pacing Threshold Amplitude: 0.75 V
Lead Channel Pacing Threshold Pulse Width: 0.5 ms
Lead Channel Pacing Threshold Pulse Width: 0.5 ms
Lead Channel Sensing Intrinsic Amplitude: 3 mV
Lead Channel Setting Pacing Amplitude: 1.375
Lead Channel Setting Pacing Pulse Width: 0.5 ms
Lead Channel Setting Sensing Sensitivity: 2 mV
MDC IDC MSMT BATTERY REMAINING LONGEVITY: 104 mo
MDC IDC MSMT LEADCHNL RA IMPEDANCE VALUE: 600 Ohm
MDC IDC MSMT LEADCHNL RA PACING THRESHOLD AMPLITUDE: 0.375 V
MDC IDC MSMT LEADCHNL RV SENSING INTR AMPL: 10.1 mV
MDC IDC SESS DTM: 20141229072350
MDC IDC SET LEADCHNL RV PACING AMPLITUDE: 1 V
MDC IDC STAT BRADY AS VS PERCENT: 42 %
MDC IDC STAT BRADY RV PERCENT PACED: 41 %

## 2013-06-09 NOTE — Telephone Encounter (Signed)
Rec'd transmission this morning at 2:23am.

## 2013-06-09 NOTE — Telephone Encounter (Signed)
New question  Pt called with questions about Today's  first remote trans.  Please give her a call back on what to do.

## 2013-06-24 ENCOUNTER — Encounter: Payer: Self-pay | Admitting: *Deleted

## 2013-07-10 DIAGNOSIS — R300 Dysuria: Secondary | ICD-10-CM | POA: Diagnosis not present

## 2013-07-25 DIAGNOSIS — Z1231 Encounter for screening mammogram for malignant neoplasm of breast: Secondary | ICD-10-CM | POA: Diagnosis not present

## 2013-07-25 DIAGNOSIS — R300 Dysuria: Secondary | ICD-10-CM | POA: Diagnosis not present

## 2013-09-10 ENCOUNTER — Encounter: Payer: Self-pay | Admitting: Internal Medicine

## 2013-09-10 ENCOUNTER — Ambulatory Visit (INDEPENDENT_AMBULATORY_CARE_PROVIDER_SITE_OTHER): Payer: Medicare Other | Admitting: *Deleted

## 2013-09-10 DIAGNOSIS — Z950 Presence of cardiac pacemaker: Secondary | ICD-10-CM | POA: Diagnosis not present

## 2013-09-10 DIAGNOSIS — I441 Atrioventricular block, second degree: Secondary | ICD-10-CM | POA: Diagnosis not present

## 2013-09-10 LAB — MDC_IDC_ENUM_SESS_TYPE_REMOTE
Brady Statistic AP VP Percent: 8.8 %
Brady Statistic AP VS Percent: 15 %
Brady Statistic AS VP Percent: 37 %
Brady Statistic AS VS Percent: 39 %
Implantable Pulse Generator Model: 2210
Implantable Pulse Generator Serial Number: 7387861
Lead Channel Impedance Value: 580 Ohm
Lead Channel Impedance Value: 640 Ohm
Lead Channel Pacing Threshold Amplitude: 0.625 V
Lead Channel Pacing Threshold Pulse Width: 0.5 ms
Lead Channel Pacing Threshold Pulse Width: 0.5 ms
Lead Channel Sensing Intrinsic Amplitude: 7.9 mV
Lead Channel Setting Pacing Amplitude: 1.375
Lead Channel Setting Pacing Pulse Width: 0.5 ms
MDC IDC MSMT BATTERY REMAINING LONGEVITY: 110 mo
MDC IDC MSMT BATTERY VOLTAGE: 2.96 V
MDC IDC MSMT LEADCHNL RA PACING THRESHOLD AMPLITUDE: 0.375 V
MDC IDC MSMT LEADCHNL RA SENSING INTR AMPL: 2.8 mV
MDC IDC SESS DTM: 20150401075544
MDC IDC SET LEADCHNL RV PACING AMPLITUDE: 0.875
MDC IDC SET LEADCHNL RV SENSING SENSITIVITY: 2 mV
MDC IDC STAT BRADY RA PERCENT PACED: 24 %
MDC IDC STAT BRADY RV PERCENT PACED: 46 %

## 2013-09-19 ENCOUNTER — Encounter: Payer: Self-pay | Admitting: *Deleted

## 2013-10-07 DIAGNOSIS — N3289 Other specified disorders of bladder: Secondary | ICD-10-CM | POA: Diagnosis not present

## 2013-10-07 DIAGNOSIS — IMO0002 Reserved for concepts with insufficient information to code with codable children: Secondary | ICD-10-CM | POA: Diagnosis not present

## 2013-10-07 DIAGNOSIS — Z23 Encounter for immunization: Secondary | ICD-10-CM | POA: Diagnosis not present

## 2013-10-07 DIAGNOSIS — E1169 Type 2 diabetes mellitus with other specified complication: Secondary | ICD-10-CM | POA: Diagnosis not present

## 2013-10-07 DIAGNOSIS — E785 Hyperlipidemia, unspecified: Secondary | ICD-10-CM | POA: Diagnosis not present

## 2013-10-07 DIAGNOSIS — I10 Essential (primary) hypertension: Secondary | ICD-10-CM | POA: Diagnosis not present

## 2013-10-17 DIAGNOSIS — Z471 Aftercare following joint replacement surgery: Secondary | ICD-10-CM | POA: Diagnosis not present

## 2013-10-17 DIAGNOSIS — Z9660 Presence of unspecified orthopedic joint implant: Secondary | ICD-10-CM | POA: Diagnosis not present

## 2013-10-24 DIAGNOSIS — N814 Uterovaginal prolapse, unspecified: Secondary | ICD-10-CM | POA: Diagnosis not present

## 2013-10-24 DIAGNOSIS — N8111 Cystocele, midline: Secondary | ICD-10-CM | POA: Diagnosis not present

## 2013-11-26 DIAGNOSIS — Z9889 Other specified postprocedural states: Secondary | ICD-10-CM | POA: Diagnosis not present

## 2013-11-26 DIAGNOSIS — N814 Uterovaginal prolapse, unspecified: Secondary | ICD-10-CM | POA: Diagnosis not present

## 2013-11-26 DIAGNOSIS — N8111 Cystocele, midline: Secondary | ICD-10-CM | POA: Diagnosis not present

## 2013-12-08 DIAGNOSIS — M6281 Muscle weakness (generalized): Secondary | ICD-10-CM | POA: Diagnosis not present

## 2013-12-08 DIAGNOSIS — N3941 Urge incontinence: Secondary | ICD-10-CM | POA: Diagnosis not present

## 2013-12-08 DIAGNOSIS — N8111 Cystocele, midline: Secondary | ICD-10-CM | POA: Diagnosis not present

## 2013-12-08 DIAGNOSIS — R279 Unspecified lack of coordination: Secondary | ICD-10-CM | POA: Diagnosis not present

## 2013-12-13 IMAGING — CR DG CHEST 2V
2 series · 2 of 2 positions shown · non-contrast
Comparison: 02/27/2012.

CLINICAL DATA: Pacemaker placement.  Left shoulder pain.

CHEST - 2 VIEW

[w chest pa]
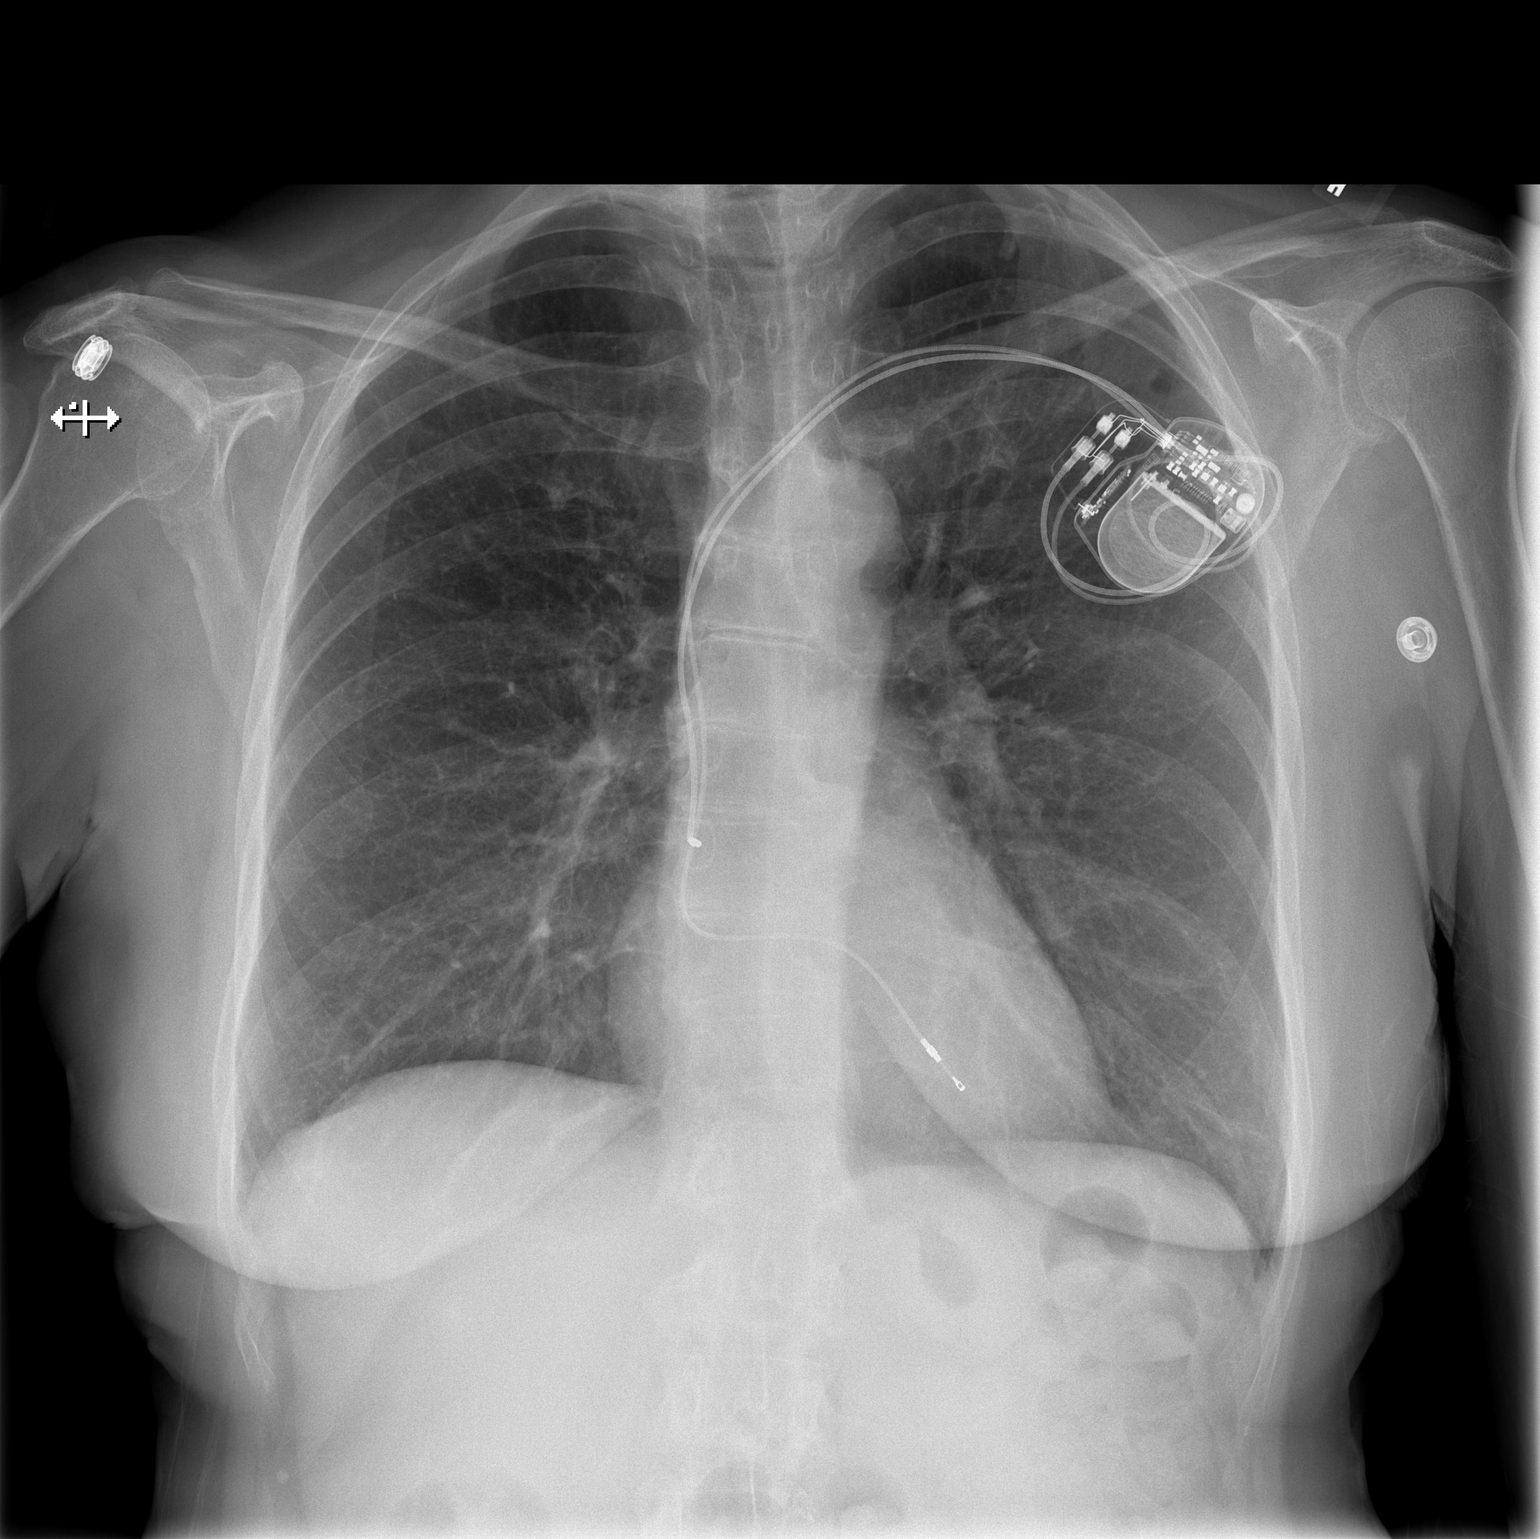

[w chest lat]
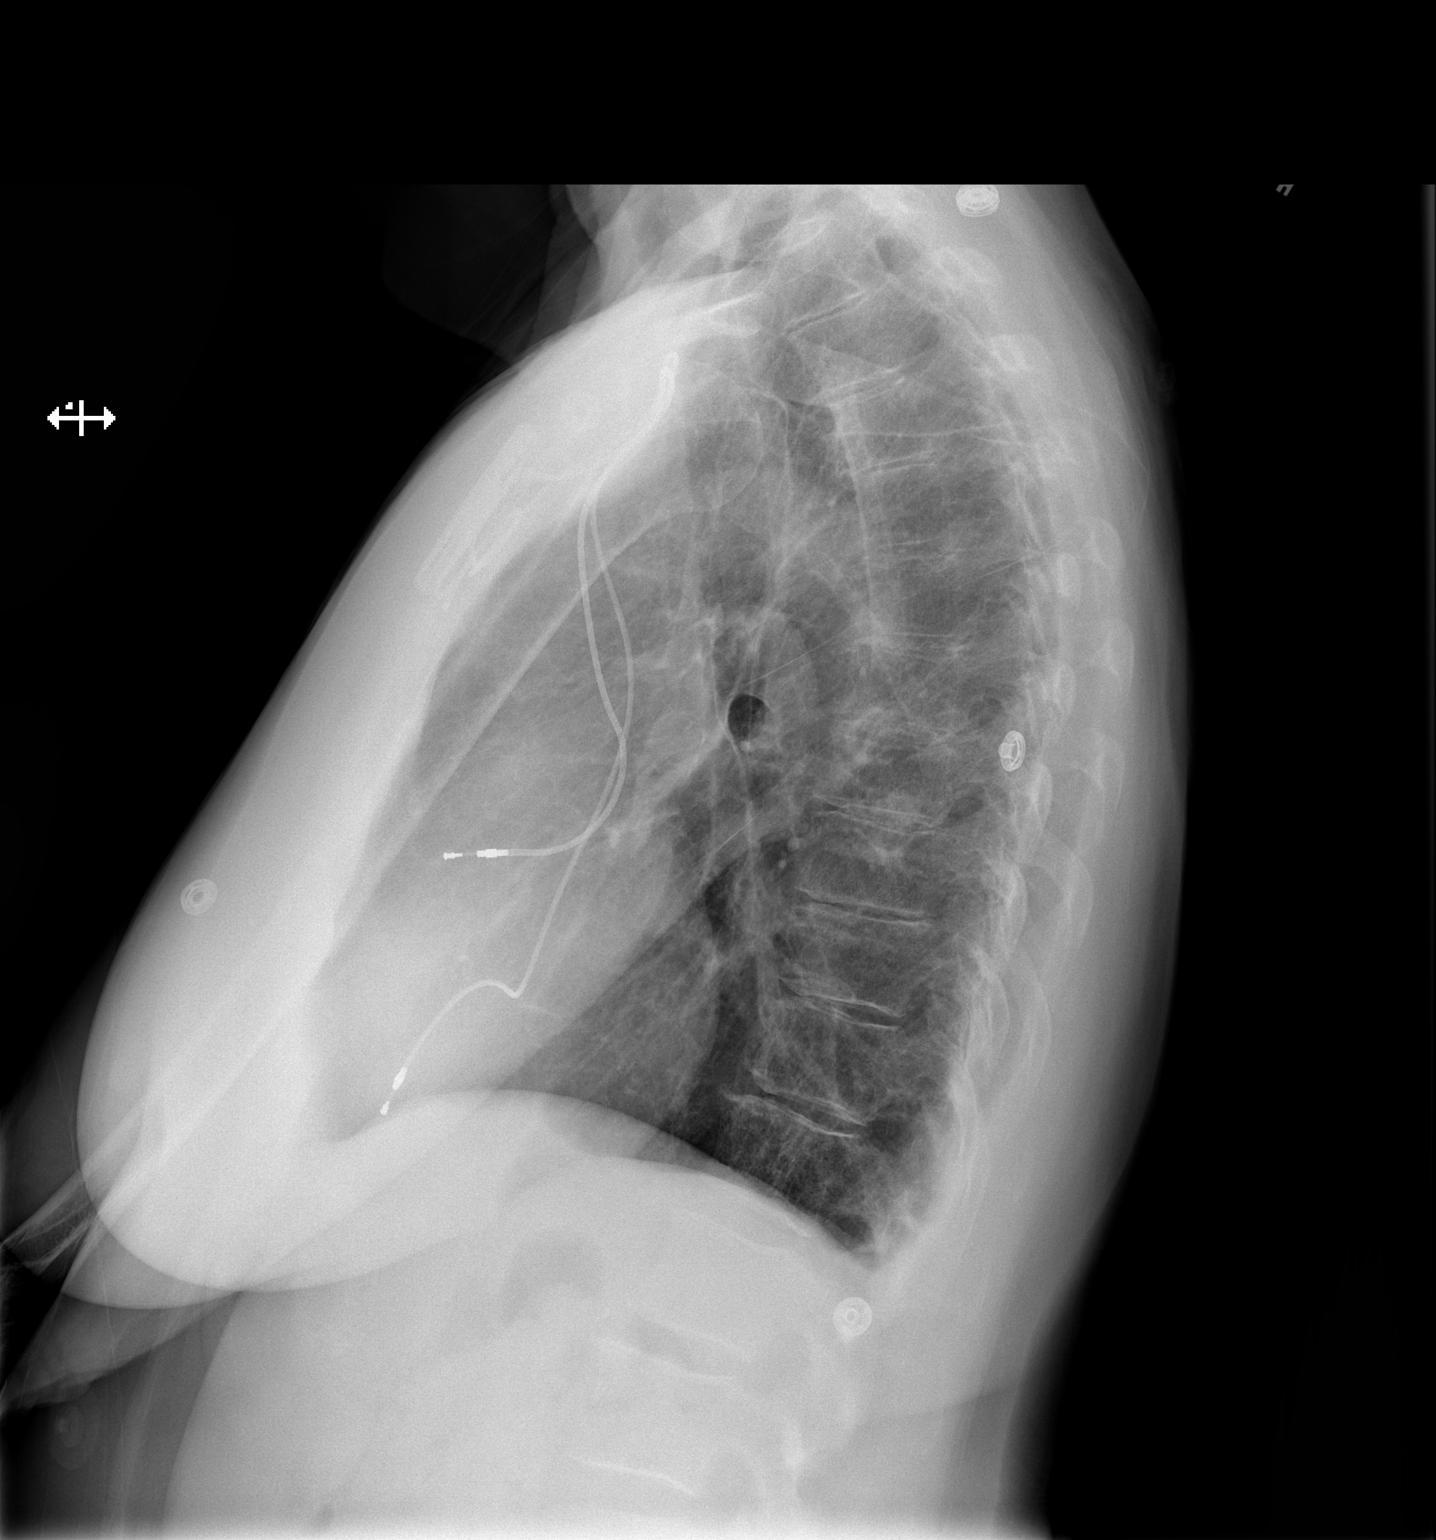

[2 of 2 positions shown; findings below may reference images not displayed]

FINDINGS: Interval left subclavian pacemaker placement with leads
in the right atrium and right ventricle.  There is a small amount
of soft tissue emphysema within the upper left chest wall adjacent
to the generator.  The lungs are clear.  There is no pleural
effusion or pneumothorax.
IMPRESSION: No demonstrated complication or acute cardiopulmonary process
status post interval left subclavian pacemaker placement.

## 2013-12-15 ENCOUNTER — Ambulatory Visit (INDEPENDENT_AMBULATORY_CARE_PROVIDER_SITE_OTHER): Payer: Medicare Other | Admitting: *Deleted

## 2013-12-15 ENCOUNTER — Encounter: Payer: Self-pay | Admitting: Internal Medicine

## 2013-12-15 DIAGNOSIS — I441 Atrioventricular block, second degree: Secondary | ICD-10-CM | POA: Diagnosis not present

## 2013-12-15 NOTE — Progress Notes (Signed)
Remote pacemaker transmission.   

## 2013-12-16 DIAGNOSIS — N3941 Urge incontinence: Secondary | ICD-10-CM | POA: Diagnosis not present

## 2013-12-16 DIAGNOSIS — N8111 Cystocele, midline: Secondary | ICD-10-CM | POA: Diagnosis not present

## 2013-12-16 DIAGNOSIS — R279 Unspecified lack of coordination: Secondary | ICD-10-CM | POA: Diagnosis not present

## 2013-12-16 DIAGNOSIS — M6281 Muscle weakness (generalized): Secondary | ICD-10-CM | POA: Diagnosis not present

## 2013-12-18 LAB — MDC_IDC_ENUM_SESS_TYPE_REMOTE
Battery Remaining Longevity: 101 mo
Battery Remaining Percentage: 74 %
Battery Voltage: 2.95 V
Brady Statistic AP VS Percent: 13 %
Brady Statistic AS VS Percent: 33 %
Brady Statistic RA Percent Paced: 23 %
Brady Statistic RV Percent Paced: 54 %
Date Time Interrogation Session: 20150706062648
Implantable Pulse Generator Model: 2210
Lead Channel Impedance Value: 660 Ohm
Lead Channel Pacing Threshold Amplitude: 0.375 V
Lead Channel Pacing Threshold Pulse Width: 0.5 ms
Lead Channel Sensing Intrinsic Amplitude: 11 mV
Lead Channel Sensing Intrinsic Amplitude: 3.4 mV
Lead Channel Setting Pacing Pulse Width: 0.5 ms
MDC IDC MSMT LEADCHNL RV IMPEDANCE VALUE: 700 Ohm
MDC IDC MSMT LEADCHNL RV PACING THRESHOLD AMPLITUDE: 0.5 V
MDC IDC MSMT LEADCHNL RV PACING THRESHOLD PULSEWIDTH: 0.5 ms
MDC IDC PG SERIAL: 7387861
MDC IDC SET LEADCHNL RA PACING AMPLITUDE: 1.375
MDC IDC SET LEADCHNL RV PACING AMPLITUDE: 0.75 V
MDC IDC SET LEADCHNL RV SENSING SENSITIVITY: 2 mV
MDC IDC STAT BRADY AP VP PERCENT: 11 %
MDC IDC STAT BRADY AS VP PERCENT: 44 %

## 2013-12-25 DIAGNOSIS — M6281 Muscle weakness (generalized): Secondary | ICD-10-CM | POA: Diagnosis not present

## 2013-12-25 DIAGNOSIS — R279 Unspecified lack of coordination: Secondary | ICD-10-CM | POA: Diagnosis not present

## 2013-12-25 DIAGNOSIS — N8111 Cystocele, midline: Secondary | ICD-10-CM | POA: Diagnosis not present

## 2013-12-25 DIAGNOSIS — N3941 Urge incontinence: Secondary | ICD-10-CM | POA: Diagnosis not present

## 2013-12-31 ENCOUNTER — Encounter: Payer: Self-pay | Admitting: Cardiology

## 2014-01-13 DIAGNOSIS — N3941 Urge incontinence: Secondary | ICD-10-CM | POA: Diagnosis not present

## 2014-01-13 DIAGNOSIS — N8111 Cystocele, midline: Secondary | ICD-10-CM | POA: Diagnosis not present

## 2014-01-13 DIAGNOSIS — R279 Unspecified lack of coordination: Secondary | ICD-10-CM | POA: Diagnosis not present

## 2014-01-13 DIAGNOSIS — M6281 Muscle weakness (generalized): Secondary | ICD-10-CM | POA: Diagnosis not present

## 2014-02-03 DIAGNOSIS — D2360 Other benign neoplasm of skin of unspecified upper limb, including shoulder: Secondary | ICD-10-CM | POA: Diagnosis not present

## 2014-02-03 DIAGNOSIS — D1801 Hemangioma of skin and subcutaneous tissue: Secondary | ICD-10-CM | POA: Diagnosis not present

## 2014-02-03 DIAGNOSIS — L819 Disorder of pigmentation, unspecified: Secondary | ICD-10-CM | POA: Diagnosis not present

## 2014-02-03 DIAGNOSIS — L723 Sebaceous cyst: Secondary | ICD-10-CM | POA: Diagnosis not present

## 2014-02-03 DIAGNOSIS — D2330 Other benign neoplasm of skin of unspecified part of face: Secondary | ICD-10-CM | POA: Diagnosis not present

## 2014-02-03 DIAGNOSIS — L821 Other seborrheic keratosis: Secondary | ICD-10-CM | POA: Diagnosis not present

## 2014-02-03 DIAGNOSIS — D2370 Other benign neoplasm of skin of unspecified lower limb, including hip: Secondary | ICD-10-CM | POA: Diagnosis not present

## 2014-02-09 DIAGNOSIS — H18519 Endothelial corneal dystrophy, unspecified eye: Secondary | ICD-10-CM | POA: Diagnosis not present

## 2014-02-09 DIAGNOSIS — H2510 Age-related nuclear cataract, unspecified eye: Secondary | ICD-10-CM | POA: Diagnosis not present

## 2014-02-09 DIAGNOSIS — E119 Type 2 diabetes mellitus without complications: Secondary | ICD-10-CM | POA: Diagnosis not present

## 2014-02-09 DIAGNOSIS — H25049 Posterior subcapsular polar age-related cataract, unspecified eye: Secondary | ICD-10-CM | POA: Diagnosis not present

## 2014-02-12 DIAGNOSIS — L723 Sebaceous cyst: Secondary | ICD-10-CM | POA: Diagnosis not present

## 2014-02-25 ENCOUNTER — Encounter: Payer: Self-pay | Admitting: Internal Medicine

## 2014-02-25 ENCOUNTER — Ambulatory Visit (INDEPENDENT_AMBULATORY_CARE_PROVIDER_SITE_OTHER): Payer: Medicare Other | Admitting: Internal Medicine

## 2014-02-25 VITALS — BP 110/82 | HR 65 | Ht 67.0 in | Wt 145.2 lb

## 2014-02-25 DIAGNOSIS — I442 Atrioventricular block, complete: Secondary | ICD-10-CM | POA: Diagnosis not present

## 2014-02-25 DIAGNOSIS — Z950 Presence of cardiac pacemaker: Secondary | ICD-10-CM

## 2014-02-25 DIAGNOSIS — I10 Essential (primary) hypertension: Secondary | ICD-10-CM | POA: Diagnosis not present

## 2014-02-25 LAB — MDC_IDC_ENUM_SESS_TYPE_INCLINIC
Battery Voltage: 2.95 V
Date Time Interrogation Session: 20150916151958
Implantable Pulse Generator Model: 2210
Implantable Pulse Generator Serial Number: 7387861
Lead Channel Impedance Value: 687.5 Ohm
Lead Channel Pacing Threshold Amplitude: 0.5 V
Lead Channel Pacing Threshold Pulse Width: 0.5 ms
Lead Channel Pacing Threshold Pulse Width: 0.5 ms
Lead Channel Sensing Intrinsic Amplitude: 11.1 mV
Lead Channel Setting Pacing Amplitude: 0.75 V
Lead Channel Setting Pacing Amplitude: 1.375
Lead Channel Setting Sensing Sensitivity: 2 mV
MDC IDC MSMT BATTERY REMAINING LONGEVITY: 116.4 mo
MDC IDC MSMT LEADCHNL RA IMPEDANCE VALUE: 687.5 Ohm
MDC IDC MSMT LEADCHNL RA PACING THRESHOLD AMPLITUDE: 0.375 V
MDC IDC MSMT LEADCHNL RA SENSING INTR AMPL: 3.7 mV
MDC IDC SET LEADCHNL RV PACING PULSEWIDTH: 0.5 ms
MDC IDC STAT BRADY RA PERCENT PACED: 23 %
MDC IDC STAT BRADY RV PERCENT PACED: 63 %

## 2014-02-25 NOTE — Progress Notes (Signed)
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  Since her last visit, the patient reports doing very well. Today, she denies symptoms of palpitations, chest pain, shortness of breath,  lower extremity edema, dizziness, presyncope, or syncope.  The patient is otherwise without complaint today.   Past Medical History  Diagnosis Date  . HLD (hyperlipidemia)   . HTN (hypertension)   . Second degree AV block     2:1 narrow complex QRS  . Glucose intolerance (impaired glucose tolerance)   . Pacemaker   . Pneumonia 1970's  . Exertional dyspnea 02/27/2012    "when I tried to go hiking"  . Osteoarthritis of both hips   . Skin cancer ~ 2007    "back"   Past Surgical History  Procedure Laterality Date  . Total hip arthroplasty  2001; 2012    right; left  . Pacemaker insertion  02/27/2012    SJM Accent DR RF implanted by Dr Rayann Heman for second degree AV block  . Tonsillectomy and adenoidectomy  ~ 1960  . Skin cancer excision  ~ 2007    Current Outpatient Prescriptions  Medication Sig Dispense Refill  . atorvastatin (LIPITOR) 20 MG tablet Take 10 mg by mouth daily.       . calcium citrate-vitamin D 200-200 MG-UNIT TABS Take 1 tablet by mouth 2 (two) times daily.       Marland Kitchen CINNAMON PO Take 2 tablets by mouth daily.      . Coenzyme Q10 (CO Q-10) 100 MG CAPS Take 100 mg by mouth daily.      Marland Kitchen glucosamine-chondroitin 500-400 MG tablet Two tabs daily      . lisinopril (PRINIVIL,ZESTRIL) 5 MG tablet Take 5 mg by mouth daily.       . metFORMIN (GLUCOPHAGE) 500 MG tablet Take 1 tablet by mouth 2 (two) times daily.      . Multiple Vitamin (MULTIVITAMIN WITH MINERALS) TABS Take 1 tablet by mouth daily.      . vitamin C (ASCORBIC ACID) 500 MG tablet Take 500 mg by mouth daily.       No current facility-administered medications for this visit.    Physical Exam: Filed Vitals:   02/25/14  1423  BP: 110/82  Pulse: 65  Height: 5\' 7"  (1.702 m)  Weight: 145 lb 3.2 oz (65.862 kg)    GEN- The patient is well appearing, alert and oriented x 3 today.   Head- normocephalic, atraumatic Eyes-  Sclera clear, conjunctiva pink Ears- hearing intact Oropharynx- clear Lungs- Clear to ausculation bilaterally, normal work of breathing Chest- pacemaker pocket is well healed Heart- Regular rate and rhythm, no murmurs, rubs or gallops, PMI not laterally displaced GI- soft, NT, ND, + BS Extremities- no clubbing, cyanosis, or edema  Pacemaker interrogation- reviewed in detail today,  See PACEART report  Assessment and Plan:  1. 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 HTN Stable No change required today  3. HL Stable No change required today  Merlin Return in 1 year

## 2014-02-25 NOTE — Patient Instructions (Signed)
Your physician wants you to follow-up in: 12 months with Dr. Vallery Ridge will receive a reminder letter in the mail two months in advance. If you don't receive a letter, please call our office to schedule the follow-up appointment.   Remote monitoring is used to monitor your Pacemaker of ICD from home. This monitoring reduces the number of office visits required to check your device to one time per year. It allows [place] to keep an eye on the functioning of your device to ensure it is working properly. You are scheduled for a device check from home on 05/26/14. You may send your transmission at any time that day. If you have a wireless device, the transmission will be sent automatically. After your physician reviews your transmission, you will receive a postcard with your next transmission date.

## 2014-03-11 DIAGNOSIS — E119 Type 2 diabetes mellitus without complications: Secondary | ICD-10-CM | POA: Diagnosis not present

## 2014-04-08 DIAGNOSIS — E785 Hyperlipidemia, unspecified: Secondary | ICD-10-CM | POA: Diagnosis not present

## 2014-04-08 DIAGNOSIS — Z008 Encounter for other general examination: Secondary | ICD-10-CM | POA: Diagnosis not present

## 2014-04-08 DIAGNOSIS — E119 Type 2 diabetes mellitus without complications: Secondary | ICD-10-CM | POA: Diagnosis not present

## 2014-04-15 DIAGNOSIS — I10 Essential (primary) hypertension: Secondary | ICD-10-CM | POA: Diagnosis not present

## 2014-04-15 DIAGNOSIS — I459 Conduction disorder, unspecified: Secondary | ICD-10-CM | POA: Diagnosis not present

## 2014-04-15 DIAGNOSIS — K5900 Constipation, unspecified: Secondary | ICD-10-CM | POA: Diagnosis not present

## 2014-04-15 DIAGNOSIS — E119 Type 2 diabetes mellitus without complications: Secondary | ICD-10-CM | POA: Diagnosis not present

## 2014-04-15 DIAGNOSIS — Z1389 Encounter for screening for other disorder: Secondary | ICD-10-CM | POA: Diagnosis not present

## 2014-04-15 DIAGNOSIS — J309 Allergic rhinitis, unspecified: Secondary | ICD-10-CM | POA: Diagnosis not present

## 2014-04-15 DIAGNOSIS — G4700 Insomnia, unspecified: Secondary | ICD-10-CM | POA: Diagnosis not present

## 2014-04-15 DIAGNOSIS — E785 Hyperlipidemia, unspecified: Secondary | ICD-10-CM | POA: Diagnosis not present

## 2014-04-15 DIAGNOSIS — Z008 Encounter for other general examination: Secondary | ICD-10-CM | POA: Diagnosis not present

## 2014-04-24 DIAGNOSIS — Z1212 Encounter for screening for malignant neoplasm of rectum: Secondary | ICD-10-CM | POA: Diagnosis not present

## 2014-05-08 DIAGNOSIS — Z23 Encounter for immunization: Secondary | ICD-10-CM | POA: Diagnosis not present

## 2014-05-12 DIAGNOSIS — Z0000 Encounter for general adult medical examination without abnormal findings: Secondary | ICD-10-CM | POA: Diagnosis not present

## 2014-05-12 DIAGNOSIS — N390 Urinary tract infection, site not specified: Secondary | ICD-10-CM | POA: Diagnosis not present

## 2014-05-12 DIAGNOSIS — R8299 Other abnormal findings in urine: Secondary | ICD-10-CM | POA: Diagnosis not present

## 2014-05-21 ENCOUNTER — Encounter (HOSPITAL_COMMUNITY): Payer: Self-pay | Admitting: Internal Medicine

## 2014-05-26 DIAGNOSIS — N390 Urinary tract infection, site not specified: Secondary | ICD-10-CM | POA: Diagnosis not present

## 2014-05-26 DIAGNOSIS — R8299 Other abnormal findings in urine: Secondary | ICD-10-CM | POA: Diagnosis not present

## 2014-05-27 ENCOUNTER — Ambulatory Visit (INDEPENDENT_AMBULATORY_CARE_PROVIDER_SITE_OTHER): Payer: Medicare Other | Admitting: *Deleted

## 2014-05-27 ENCOUNTER — Encounter: Payer: Self-pay | Admitting: Internal Medicine

## 2014-05-27 DIAGNOSIS — I442 Atrioventricular block, complete: Secondary | ICD-10-CM | POA: Diagnosis not present

## 2014-05-27 LAB — MDC_IDC_ENUM_SESS_TYPE_REMOTE
Battery Remaining Longevity: 102 mo
Battery Remaining Percentage: 74 %
Battery Voltage: 2.95 V
Brady Statistic AP VP Percent: 9.9 %
Brady Statistic AS VS Percent: 1 %
Brady Statistic RV Percent Paced: 99 %
Date Time Interrogation Session: 20151216072009
Implantable Pulse Generator Model: 2210
Implantable Pulse Generator Serial Number: 7387861
Lead Channel Impedance Value: 730 Ohm
Lead Channel Pacing Threshold Amplitude: 0.375 V
Lead Channel Pacing Threshold Pulse Width: 0.5 ms
Lead Channel Pacing Threshold Pulse Width: 0.5 ms
Lead Channel Sensing Intrinsic Amplitude: 4 mV
MDC IDC MSMT LEADCHNL RV IMPEDANCE VALUE: 710 Ohm
MDC IDC MSMT LEADCHNL RV PACING THRESHOLD AMPLITUDE: 0.5 V
MDC IDC MSMT LEADCHNL RV SENSING INTR AMPL: 9.2 mV
MDC IDC SET LEADCHNL RA PACING AMPLITUDE: 1.375
MDC IDC SET LEADCHNL RV PACING AMPLITUDE: 0.75 V
MDC IDC SET LEADCHNL RV PACING PULSEWIDTH: 0.5 ms
MDC IDC SET LEADCHNL RV SENSING SENSITIVITY: 2 mV
MDC IDC STAT BRADY AP VS PERCENT: 1 %
MDC IDC STAT BRADY AS VP PERCENT: 90 %
MDC IDC STAT BRADY RA PERCENT PACED: 9.8 %

## 2014-05-27 NOTE — Progress Notes (Signed)
Remote pacemaker transmission.   

## 2014-05-28 ENCOUNTER — Other Ambulatory Visit: Payer: Self-pay | Admitting: Internal Medicine

## 2014-06-12 HISTORY — PX: CYST REMOVAL NECK: SHX6281

## 2014-06-18 ENCOUNTER — Encounter: Payer: Self-pay | Admitting: Cardiology

## 2014-08-21 ENCOUNTER — Encounter: Payer: Self-pay | Admitting: Internal Medicine

## 2014-08-31 ENCOUNTER — Ambulatory Visit (INDEPENDENT_AMBULATORY_CARE_PROVIDER_SITE_OTHER): Payer: Medicare Other | Admitting: *Deleted

## 2014-08-31 DIAGNOSIS — I442 Atrioventricular block, complete: Secondary | ICD-10-CM

## 2014-08-31 NOTE — Progress Notes (Signed)
Remote pacemaker transmission.   

## 2014-09-02 LAB — MDC_IDC_ENUM_SESS_TYPE_REMOTE
Battery Remaining Percentage: 74 %
Battery Voltage: 2.95 V
Brady Statistic AP VP Percent: 10 %
Brady Statistic RA Percent Paced: 10 %
Brady Statistic RV Percent Paced: 99 %
Date Time Interrogation Session: 20160321095308
Lead Channel Impedance Value: 710 Ohm
Lead Channel Pacing Threshold Pulse Width: 0.5 ms
Lead Channel Sensing Intrinsic Amplitude: 3.1 mV
Lead Channel Sensing Intrinsic Amplitude: 8.2 mV
Lead Channel Setting Pacing Pulse Width: 0.5 ms
Lead Channel Setting Sensing Sensitivity: 2 mV
MDC IDC MSMT BATTERY REMAINING LONGEVITY: 103 mo
MDC IDC MSMT LEADCHNL RA PACING THRESHOLD AMPLITUDE: 0.375 V
MDC IDC MSMT LEADCHNL RA PACING THRESHOLD PULSEWIDTH: 0.5 ms
MDC IDC MSMT LEADCHNL RV IMPEDANCE VALUE: 660 Ohm
MDC IDC MSMT LEADCHNL RV PACING THRESHOLD AMPLITUDE: 0.5 V
MDC IDC PG SERIAL: 7387861
MDC IDC SET LEADCHNL RA PACING AMPLITUDE: 1.375
MDC IDC SET LEADCHNL RV PACING AMPLITUDE: 0.75 V
MDC IDC STAT BRADY AP VS PERCENT: 1 %
MDC IDC STAT BRADY AS VP PERCENT: 89 %
MDC IDC STAT BRADY AS VS PERCENT: 1 %

## 2014-09-17 ENCOUNTER — Encounter: Payer: Self-pay | Admitting: Cardiology

## 2014-09-21 ENCOUNTER — Encounter: Payer: Self-pay | Admitting: Internal Medicine

## 2014-10-08 ENCOUNTER — Ambulatory Visit (INDEPENDENT_AMBULATORY_CARE_PROVIDER_SITE_OTHER): Payer: Medicare Other | Admitting: Internal Medicine

## 2014-10-08 ENCOUNTER — Encounter: Payer: Self-pay | Admitting: Internal Medicine

## 2014-10-08 VITALS — BP 132/90 | HR 112 | Ht 66.0 in | Wt 147.0 lb

## 2014-10-08 DIAGNOSIS — E119 Type 2 diabetes mellitus without complications: Secondary | ICD-10-CM

## 2014-10-08 DIAGNOSIS — Z1211 Encounter for screening for malignant neoplasm of colon: Secondary | ICD-10-CM | POA: Diagnosis not present

## 2014-10-08 DIAGNOSIS — K625 Hemorrhage of anus and rectum: Secondary | ICD-10-CM

## 2014-10-08 DIAGNOSIS — K6289 Other specified diseases of anus and rectum: Secondary | ICD-10-CM

## 2014-10-08 DIAGNOSIS — K5901 Slow transit constipation: Secondary | ICD-10-CM

## 2014-10-08 MED ORDER — NA SULFATE-K SULFATE-MG SULF 17.5-3.13-1.6 GM/177ML PO SOLN: 1.0000 | Freq: Once | ORAL | Status: DC

## 2014-10-08 NOTE — Progress Notes (Signed)
HISTORY OF PRESENT ILLNESS:  Lauren Norton is a 69 y.o. female who is sent today by Dr. Dagmar Hait regarding rectal bleeding and the need for colonoscopy. The patient reports having had a colonoscopy in [place] greater than 10 years ago. Apparently, no polyps. No records were available for review (she checked with the office in the [place]). In any event, she reports intermittent rectal bleeding associated with rectal discomfort. As a fullness sensation suggesting hemorrhoids. She was recently given hydrocortisone ointment which has helped. She denies abdominal discomfort or change in bowel habits though she has chronic constipation. She states that MiraLAX helps, but has been reluctant to use it based on the messages from providers. She has diabetes mellitus which is under good control. Only on metformin. No family history of colon cancer. She is a retired Pharmacist, hospital  REVIEW OF SYSTEMS:  All non-GI ROS negative except for urinary leakage  Past Medical History  Diagnosis Date  . HLD (hyperlipidemia)   . HTN (hypertension)   . Second degree AV block     2:1 narrow complex QRS  . Glucose intolerance (impaired glucose tolerance)   . Pacemaker   . Pneumonia 1970's  . Exertional dyspnea 02/27/2012    "when I tried to go hiking"  . Osteoarthritis of both hips   . Skin cancer ~ 2007    "back"  . Diabetes   . Anxiety   . Shingles   . Allergic rhinitis   . Pneumonia     Past Surgical History  Procedure Laterality Date  . Total hip arthroplasty Bilateral 2001; 2012    right; left  . Tonsillectomy and adenoidectomy  ~ 1960  . Skin cancer excision  ~ 2007  . Permanent pacemaker insertion N/A 02/27/2012    Procedure: PERMANENT PACEMAKER INSERTION;  Surgeon: Thompson Grayer, MD;  [facility] C[facility] LAB;  Service: Cardiovascular;  Laterality: N/A;    Social History AKYLAH HASCALL  reports that she has never smoked. She has never used smokeless tobacco. She reports that she drinks about 4.2 oz of  alcohol per week. She reports that she does not use illicit drugs.  family history includes Alzheimer's disease in her mother; CAD in her father; Diabetes in her father; Heart attack in her father; Heart disease in her maternal grandmother; Hypertension in her father; Prostate cancer in her father.  Allergies  Allergen Reactions  . Sulfa Antibiotics Itching       PHYSICAL EXAMINATION: Vital signs: BP 132/90 mmHg  Pulse 112  Ht 5\' 6"  (1.676 m)  Wt 147 lb (66.679 kg)  BMI 23.74 kg/m2  Constitutional: generally well-appearing, no acute distress Psychiatric: alert and oriented x3, cooperative Eyes: extraocular movements intact, anicteric, conjunctiva pink Mouth: oral pharynx moist, no lesions Neck: supple no lymphadenopathy Cardiovascular: heart regular rate and rhythm, no murmur Lungs: clear to auscultation bilaterally Abdomen: soft, nontender, nondistended, no obvious ascites, no peritoneal signs, normal bowel sounds, no organomegaly Rectal: Deferred until colonoscopy Extremities: no lower extremity edema bilaterally Skin: no lesions on visible extremities Neuro: No focal deficits. No asterixis.   ASSESSMENT:  #1. Intermittent rectal bleeding and rectal pain. Rule out hemorrhoid. Rule out fissure #2. Colon cancer screening. Due for follow-up screening. Last exam greater than 10 years ago #3. Chronic constipation. Significant problem for her #4. Diabetes mellitus #5. Gen. medical problems   PLAN:  #1. Colonoscopy to evaluate rectal bleeding and provide colorectal neoplasia screening. #2Faythe Ghee to use MiraLAX daily. Titrated to need. She was delighted #3.  Hold diabetic medications the day of the procedure to avoid unwanted hypoglycemia  A copy of this consultation at has been sent to Dr. Dagmar Hait

## 2014-10-08 NOTE — Patient Instructions (Signed)

## 2014-10-15 ENCOUNTER — Encounter: Payer: Self-pay | Admitting: Internal Medicine

## 2014-11-03 ENCOUNTER — Encounter: Payer: Self-pay | Admitting: Internal Medicine

## 2014-11-03 ENCOUNTER — Ambulatory Visit (AMBULATORY_SURGERY_CENTER): Payer: Medicare Other | Admitting: Internal Medicine

## 2014-11-03 ENCOUNTER — Encounter: Payer: Medicare Other | Admitting: Internal Medicine

## 2014-11-03 VITALS — BP 93/45 | HR 64 | Temp 97.0°F | Resp 37 | Ht 66.0 in | Wt 147.0 lb

## 2014-11-03 DIAGNOSIS — D124 Benign neoplasm of descending colon: Secondary | ICD-10-CM | POA: Diagnosis not present

## 2014-11-03 DIAGNOSIS — K635 Polyp of colon: Secondary | ICD-10-CM | POA: Diagnosis not present

## 2014-11-03 DIAGNOSIS — K625 Hemorrhage of anus and rectum: Secondary | ICD-10-CM

## 2014-11-03 DIAGNOSIS — Z1211 Encounter for screening for malignant neoplasm of colon: Secondary | ICD-10-CM

## 2014-11-03 LAB — GLUCOSE, CAPILLARY
GLUCOSE-CAPILLARY: 141 mg/dL — AB (ref 65–99)
GLUCOSE-CAPILLARY: 103 mg/dL — AB (ref 65–99)

## 2014-11-03 MED ORDER — SODIUM CHLORIDE 0.9 % IV SOLN: 500.0000 mL | INTRAVENOUS | Status: DC

## 2014-11-03 NOTE — Patient Instructions (Signed)
YOU HAD AN ENDOSCOPIC PROCEDURE TODAY AT THE [facility]:   Refer to the procedure report that was given to you for any specific questions about what was found during the examination.  If the procedure report does not answer your questions, please call your gastroenterologist to clarify.  If you requested that your care partner not be given the details of your procedure findings, then the procedure report has been included in a sealed envelope for you to review at your convenience later.  YOU SHOULD EXPECT: Some feelings of bloating in the abdomen. Passage of more gas than usual.  Walking can help get rid of the air that was put into your GI tract during the procedure and reduce the bloating. If you had a lower endoscopy (such as a colonoscopy or flexible sigmoidoscopy) you may notice spotting of blood in your stool or on the toilet paper. If you underwent a bowel prep for your procedure, you may not have a normal bowel movement for a few days.  Please Note:  You might notice some irritation and congestion in your nose or some drainage.  This is from the oxygen used during your procedure.  There is no need for concern and it should clear up in a day or so.  SYMPTOMS TO REPORT IMMEDIATELY:   Following lower endoscopy (colonoscopy or flexible sigmoidoscopy):  Excessive amounts of blood in the stool  Significant tenderness or worsening of abdominal pains  Swelling of the abdomen that is new, acute  Fever of 100F or higher   For urgent or emergent issues, a gastroenterologist can be reached at any hour by calling [phone].   DIET: Your first meal following the procedure should be a small meal and then it is ok to progress to your normal diet. Heavy or fried foods are harder to digest and may make you feel nauseous or bloated.  Likewise, meals heavy in dairy and vegetables can increase bloating.  Drink plenty of fluids but you should avoid alcoholic beverages for 24  hours.  ACTIVITY:  You should plan to take it easy for the rest of today and you should NOT DRIVE or use heavy machinery until tomorrow (because of the sedation medicines used during the test).    FOLLOW UP: Our staff will call the number listed on your records the next business day following your procedure to check on you and address any questions or concerns that you may have regarding the information given to you following your procedure. If we do not reach you, we will leave a message.  However, if you are feeling well and you are not experiencing any problems, there is no need to return our call.  We will assume that you have returned to your regular daily activities without incident.  If any biopsies were taken you will be contacted by phone or by letter within the next 1-3 weeks.  Please call us at [phone] if you have not heard about the biopsies in 3 weeks.    SIGNATURES/CONFIDENTIALITY: You and/or your care partner have signed paperwork which will be entered into your electronic medical record.  These signatures attest to the fact that that the information above on your After Visit Summary has been reviewed and is understood.  Full responsibility of the confidentiality of this discharge information lies with you and/or your care-partner.   Handouts were given to your care partner on polyps, hemorrhoids, and a high fiber diet with liberal fluid intake. You may resume your   your current medications today.  Your blood sugar was 103 in the recovery room. Await biopsy results. Please call if any questions or concerns.

## 2014-11-03 NOTE — Op Note (Signed)
[facility]  [address] [place], [id number]   COLONOSCOPY PROCEDURE REPORT  PATIENT: Lauren Norton, Lauren Norton  MR#: [id number] BIRTHDATE: 07-Mar-1946 , 68  yrs. old GENDER: female ENDOSCOPIST: Eustace Quail, MD REFERRED VO:[id number] Avva, M.D. PROCEDURE DATE:  11/03/2014 PROCEDURE:   Colonoscopy, screening and Colonoscopy with snare polypectomy x 1 First Screening Colonoscopy - Avg.  risk and is 50 yrs.  old or older - No.  Prior Negative Screening - Now for repeat screening. 10 or more years since last screening  History of Adenoma - Now for follow-up colonoscopy & has been > or = to 3 yrs.  N/A  Polyps removed today? Yes ASA CLASS:   Class III INDICATIONS:Screening for colonic neoplasia and Colorectal Neoplasm Risk Assessment for this procedure is average risk.  . Previous examination in [place] 10+ years ago reported as negative MEDICATIONS: Monitored anesthesia care and Propofol 200 mg IV  DESCRIPTION OF PROCEDURE:   After the risks benefits and alternatives of the procedure were thoroughly explained, informed consent was obtained.  The digital rectal exam revealed no abnormalities of the rectum.   The LB [id number]  endoscope was introduced through the anus and advanced to the cecum, which was identified by both the appendix and ileocecal valve. No adverse events experienced.   The quality of the prep was good.  (MoviPrep was used)  The instrument was then slowly withdrawn as the colon was fully examined. Estimated blood loss is zero unless otherwise noted in this procedure report.    COLON FINDINGS: A single polyp measuring 3 mm in size was found in the descending colon.  A polypectomy was performed with a cold snare.  The resection was complete, the polyp tissue was completely retrieved and sent to histology.   The examination was otherwise normal.  Retroflexed views revealed internal hemorrhoids. The time to cecum = 7.5 Withdrawal time = 8.4    The scope was withdrawn and the procedure completed. COMPLICATIONS: There were no immediate complications.  ENDOSCOPIC IMPRESSION: 1.   Single polyp was found in the descending colon; polypectomy was performed with a cold snare 2.   The examination was otherwise normal  RECOMMENDATIONS: 1. Repeat colonoscopy in 5 years if polyp adenomatous; otherwise 10 years  eSigned:  Eustace Quail, MD 11/03/2014 10:59 AM   cc: Prince Solian, MD and The Patient

## 2014-11-03 NOTE — Progress Notes (Signed)
No problems noted in the recovery room. maw 

## 2014-11-03 NOTE — Progress Notes (Signed)
Called to room to assist during endoscopic procedure.  Patient ID and intended procedure confirmed with present staff. Received instructions for my participation in the procedure from the performing physician.  

## 2014-11-03 NOTE — Progress Notes (Signed)
Report to PACU, RN, vss, BBS= Clear.  

## 2014-11-04 ENCOUNTER — Telehealth: Payer: Self-pay | Admitting: *Deleted

## 2014-11-04 NOTE — Telephone Encounter (Signed)
  Follow up Call-  Call back number 11/03/2014  Post procedure Call Back phone  # [phone]  Permission to leave phone message Yes     Patient questions:  Do you have a fever, pain , or abdominal swelling? No. Pain Score  0 *  Have you tolerated food without any problems? Yes.    Have you been able to return to your normal activities? Yes.    Do you have any questions about your discharge instructions: Diet   No. Medications  No. Follow up visit  No.  Do you have questions or concerns about your Care? No.  Actions: * If pain score is 4 or above: No action needed, pain <4.

## 2014-11-11 ENCOUNTER — Encounter: Payer: Self-pay | Admitting: Internal Medicine

## 2014-12-02 ENCOUNTER — Ambulatory Visit (INDEPENDENT_AMBULATORY_CARE_PROVIDER_SITE_OTHER): Payer: Medicare Other | Admitting: *Deleted

## 2014-12-02 DIAGNOSIS — I442 Atrioventricular block, complete: Secondary | ICD-10-CM

## 2014-12-03 NOTE — Progress Notes (Signed)
Remote pacemaker transmission.   

## 2014-12-06 LAB — CUP PACEART REMOTE DEVICE CHECK
Battery Remaining Longevity: 110 mo
Brady Statistic AP VP Percent: 10 %
Brady Statistic AP VS Percent: 1 %
Brady Statistic AS VS Percent: 1 %
Brady Statistic RA Percent Paced: 10 %
Brady Statistic RV Percent Paced: 99 %
Date Time Interrogation Session: 20160622062919
Lead Channel Impedance Value: 680 Ohm
Lead Channel Pacing Threshold Pulse Width: 0.5 ms
Lead Channel Sensing Intrinsic Amplitude: 3.6 mV
Lead Channel Sensing Intrinsic Amplitude: 7.7 mV
Lead Channel Setting Pacing Amplitude: 0.75 V
Lead Channel Setting Pacing Amplitude: 1.375
Lead Channel Setting Pacing Pulse Width: 0.5 ms
Lead Channel Setting Sensing Sensitivity: 2 mV
MDC IDC MSMT BATTERY REMAINING PERCENTAGE: 81 %
MDC IDC MSMT BATTERY VOLTAGE: 2.93 V
MDC IDC MSMT LEADCHNL RA IMPEDANCE VALUE: 650 Ohm
MDC IDC MSMT LEADCHNL RA PACING THRESHOLD AMPLITUDE: 0.375 V
MDC IDC MSMT LEADCHNL RV PACING THRESHOLD AMPLITUDE: 0.5 V
MDC IDC MSMT LEADCHNL RV PACING THRESHOLD PULSEWIDTH: 0.5 ms
MDC IDC STAT BRADY AS VP PERCENT: 89 %
Pulse Gen Serial Number: 7387861

## 2014-12-09 ENCOUNTER — Encounter: Payer: Self-pay | Admitting: Cardiology

## 2014-12-21 ENCOUNTER — Encounter: Payer: Self-pay | Admitting: Internal Medicine

## 2015-03-01 ENCOUNTER — Encounter: Payer: Self-pay | Admitting: Internal Medicine

## 2015-03-01 ENCOUNTER — Ambulatory Visit (INDEPENDENT_AMBULATORY_CARE_PROVIDER_SITE_OTHER): Payer: Medicare Other | Admitting: Internal Medicine

## 2015-03-01 VITALS — BP 128/78 | HR 86 | Ht 66.0 in | Wt 149.8 lb

## 2015-03-01 DIAGNOSIS — I442 Atrioventricular block, complete: Secondary | ICD-10-CM | POA: Diagnosis not present

## 2015-03-01 DIAGNOSIS — I10 Essential (primary) hypertension: Secondary | ICD-10-CM | POA: Diagnosis not present

## 2015-03-01 DIAGNOSIS — Z950 Presence of cardiac pacemaker: Secondary | ICD-10-CM | POA: Diagnosis not present

## 2015-03-01 NOTE — Patient Instructions (Addendum)
Medication Instructions:  Your physician recommends that you continue on your current medications as directed. Please refer to the Current Medication list given to you today.   Labwork: None ordered  Testing/Procedures: None ordered  Follow-Up: Your physician wants you to follow-up in: 12 months with Dr Allred You will receive a reminder letter in the mail two months in advance. If you don't receive a letter, please call our office to schedule the follow-up appointment.  Remote monitoring is used to monitor your Pacemaker  from home. This monitoring reduces the number of office visits required to check your device to one time per year. It allows us to keep an eye on the functioning of your device to ensure it is working properly. You are scheduled for a device check from home on 05/31/15. You may send your transmission at any time that day. If you have a wireless device, the transmission will be sent automatically. After your physician reviews your transmission, you will receive a postcard with your next transmission date.    Any Other Special Instructions Will Be Listed Below (If Applicable).   

## 2015-03-01 NOTE — Progress Notes (Signed)
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 female who presents today for routine electrophysiology followup.  Since her last visit, the patient reports doing very well.  Her energy improved with shortening of AV intervals last visit.  Today, she denies symptoms of palpitations, chest pain, shortness of breath,  lower extremity edema, dizziness, presyncope, or syncope.  The patient is otherwise without complaint today.   Past Medical History  Diagnosis Date  . HLD (hyperlipidemia)   . HTN (hypertension)   . Second degree AV block     2:1 narrow complex QRS  . Glucose intolerance (impaired glucose tolerance)   . Pacemaker   . Pneumonia 1970's  . Exertional dyspnea 02/27/2012    "when I tried to go hiking"  . Osteoarthritis of both hips   . Skin cancer ~ 2007    "back"  . Diabetes   . Anxiety   . Shingles   . Allergic rhinitis   . Pneumonia    Past Surgical History  Procedure Laterality Date  . Total hip arthroplasty Bilateral 2001; 2012    right; left  . Tonsillectomy and adenoidectomy  ~ 1960  . Skin cancer excision  ~ 2007  . Permanent pacemaker insertion N/A 02/27/2012    Procedure: PERMANENT PACEMAKER INSERTION;  Surgeon: Thompson Grayer, MD;  Location: [facility] CATH LAB;  Service: Cardiovascular;  Laterality: N/A;    Current Outpatient Prescriptions  Medication Sig Dispense Refill  . atorvastatin (LIPITOR) 20 MG tablet Take 10 mg by mouth daily.     . Calcium-Phosphorus-Vitamin D (CITRACAL +D3 PO) Take 1 tablet by mouth 3 (three) times daily.    . cetirizine (ZYRTEC) 10 MG chewable tablet Chew 5 mg by mouth daily.    Marland Kitchen CRANBERRY PO Take 4,200 mg by mouth 2 (two) times daily.    . hydrocortisone 1 % ointment Apply 1 application topically daily as needed for itching.     Marland Kitchen lisinopril (PRINIVIL,ZESTRIL) 5 MG tablet Take 5 mg by mouth daily.     . metFORMIN (GLUCOPHAGE) 500 MG tablet Take 1 tablet by mouth in the morning and 2 at night  depending on blood sugars    . Omega-3 Fatty Acids (FISH OIL) 1200 MG CPDR Take 1 capsule by mouth daily.    . polyethylene glycol powder (GLYCOLAX/MIRALAX) powder Take 17 g by mouth daily as needed (constipation).      No current facility-administered medications for this visit.    Physical Exam: Filed Vitals:   03/01/15 1430  BP: 128/78  Pulse: 86  Height: 5\' 6"  (1.676 m)  Weight: 149 lb 12.8 oz (67.949 kg)    GEN- The patient is well appearing, alert and oriented x 3 today.   Head- normocephalic, atraumatic Eyes-  Sclera clear, conjunctiva pink Ears- hearing intact Oropharynx- clear Lungs- Clear to ausculation bilaterally, normal work of breathing Chest- pacemaker pocket is well healed Heart- Regular rate and rhythm, no murmurs, rubs or gallops, PMI not laterally displaced GI- soft, NT, ND, + BS Extremities- no clubbing, cyanosis, or edema  Pacemaker interrogation- reviewed in detail today,  See PACEART report  Assessment and Plan:  1. Complete AV block Normal pacemaker function See Pace Art report No changes today  2. HTN Stable No change required today  3. HL Stable No change required today  Merlin Return in 1 year

## 2015-03-02 LAB — CUP PACEART INCLINIC DEVICE CHECK
Battery Remaining Longevity: 104.4 mo
Battery Voltage: 2.93 V
Brady Statistic RA Percent Paced: 12 %
Lead Channel Impedance Value: 675 Ohm
Lead Channel Impedance Value: 725 Ohm
Lead Channel Pacing Threshold Amplitude: 0.375 V
Lead Channel Pacing Threshold Pulse Width: 0.5 ms
Lead Channel Pacing Threshold Pulse Width: 0.5 ms
Lead Channel Sensing Intrinsic Amplitude: 3.9 mV
Lead Channel Sensing Intrinsic Amplitude: 9.6 mV
Lead Channel Setting Pacing Amplitude: 0.75 V
Lead Channel Setting Pacing Amplitude: 1.375
Lead Channel Setting Pacing Pulse Width: 0.5 ms
MDC IDC MSMT LEADCHNL RV PACING THRESHOLD AMPLITUDE: 0.5 V
MDC IDC PG SERIAL: 7387861
MDC IDC SESS DTM: 20160919183611
MDC IDC SET LEADCHNL RV SENSING SENSITIVITY: 2 mV
MDC IDC STAT BRADY RV PERCENT PACED: 99.91 %

## 2015-05-31 ENCOUNTER — Ambulatory Visit (INDEPENDENT_AMBULATORY_CARE_PROVIDER_SITE_OTHER): Payer: Medicare Other | Admitting: *Deleted

## 2015-05-31 DIAGNOSIS — I442 Atrioventricular block, complete: Secondary | ICD-10-CM | POA: Diagnosis not present

## 2015-06-01 NOTE — Progress Notes (Signed)
Remote pacemaker transmission.   

## 2015-06-03 LAB — CUP PACEART REMOTE DEVICE CHECK
Battery Voltage: 2.95 V
Brady Statistic AP VP Percent: 12 %
Brady Statistic AS VP Percent: 88 %
Brady Statistic AS VS Percent: 1 %
Date Time Interrogation Session: 20161219072705
Implantable Lead Location: 753859
Implantable Lead Location: 753860
Implantable Lead Model: 1944
Lead Channel Impedance Value: 640 Ohm
Lead Channel Pacing Threshold Pulse Width: 0.5 ms
Lead Channel Sensing Intrinsic Amplitude: 3.2 mV
Lead Channel Setting Pacing Pulse Width: 0.5 ms
Lead Channel Setting Sensing Sensitivity: 2 mV
MDC IDC LEAD IMPLANT DT: 20130917
MDC IDC LEAD IMPLANT DT: 20130917
MDC IDC LEAD MODEL: 1948
MDC IDC MSMT BATTERY REMAINING LONGEVITY: 122 mo
MDC IDC MSMT BATTERY REMAINING PERCENTAGE: 91 %
MDC IDC MSMT LEADCHNL RA IMPEDANCE VALUE: 580 Ohm
MDC IDC MSMT LEADCHNL RA PACING THRESHOLD AMPLITUDE: 0.375 V
MDC IDC MSMT LEADCHNL RA PACING THRESHOLD PULSEWIDTH: 0.5 ms
MDC IDC MSMT LEADCHNL RV PACING THRESHOLD AMPLITUDE: 0.5 V
MDC IDC PG SERIAL: 7387861
MDC IDC SET LEADCHNL RA PACING AMPLITUDE: 1.375
MDC IDC SET LEADCHNL RV PACING AMPLITUDE: 0.75 V
MDC IDC STAT BRADY AP VS PERCENT: 1 %
MDC IDC STAT BRADY RA PERCENT PACED: 12 %
MDC IDC STAT BRADY RV PERCENT PACED: 99 %
Pulse Gen Model: 2210

## 2015-06-04 ENCOUNTER — Encounter: Payer: Self-pay | Admitting: Cardiology

## 2015-06-13 DIAGNOSIS — H269 Unspecified cataract: Secondary | ICD-10-CM

## 2015-06-13 HISTORY — PX: CATARACT EXTRACTION, BILATERAL: SHX1313

## 2015-06-13 HISTORY — DX: Unspecified cataract: H26.9

## 2015-08-30 ENCOUNTER — Ambulatory Visit (INDEPENDENT_AMBULATORY_CARE_PROVIDER_SITE_OTHER): Payer: Medicare Other | Admitting: *Deleted

## 2015-08-30 DIAGNOSIS — I442 Atrioventricular block, complete: Secondary | ICD-10-CM | POA: Diagnosis not present

## 2015-08-30 DIAGNOSIS — Z950 Presence of cardiac pacemaker: Secondary | ICD-10-CM | POA: Diagnosis not present

## 2015-08-31 NOTE — Progress Notes (Signed)
Remote pacemaker transmission.   

## 2015-10-07 LAB — CUP PACEART REMOTE DEVICE CHECK
Battery Remaining Longevity: 109 mo
Battery Remaining Percentage: 81 %
Brady Statistic AP VS Percent: 1 %
Implantable Lead Implant Date: 20130917
Implantable Lead Location: 753860
Lead Channel Pacing Threshold Amplitude: 0.375 V
Lead Channel Pacing Threshold Amplitude: 0.625 V
Lead Channel Pacing Threshold Pulse Width: 0.5 ms
Lead Channel Sensing Intrinsic Amplitude: 2.6 mV
Lead Channel Setting Pacing Amplitude: 1.375
Lead Channel Setting Sensing Sensitivity: 2 mV
MDC IDC LEAD IMPLANT DT: 20130917
MDC IDC LEAD LOCATION: 753859
MDC IDC LEAD MODEL: 1944
MDC IDC LEAD MODEL: 1948
MDC IDC MSMT BATTERY VOLTAGE: 2.93 V
MDC IDC MSMT LEADCHNL RA IMPEDANCE VALUE: 590 Ohm
MDC IDC MSMT LEADCHNL RV IMPEDANCE VALUE: 650 Ohm
MDC IDC MSMT LEADCHNL RV PACING THRESHOLD PULSEWIDTH: 0.5 ms
MDC IDC SESS DTM: 20170320060251
MDC IDC SET LEADCHNL RV PACING AMPLITUDE: 0.875
MDC IDC SET LEADCHNL RV PACING PULSEWIDTH: 0.5 ms
MDC IDC STAT BRADY AP VP PERCENT: 12 %
MDC IDC STAT BRADY AS VP PERCENT: 88 %
MDC IDC STAT BRADY AS VS PERCENT: 1 %
MDC IDC STAT BRADY RA PERCENT PACED: 12 %
MDC IDC STAT BRADY RV PERCENT PACED: 99 %
Pulse Gen Model: 2210
Pulse Gen Serial Number: 7387861

## 2015-10-08 ENCOUNTER — Encounter: Payer: Self-pay | Admitting: Cardiology

## 2015-11-29 ENCOUNTER — Ambulatory Visit (INDEPENDENT_AMBULATORY_CARE_PROVIDER_SITE_OTHER): Payer: Medicare Other | Admitting: *Deleted

## 2015-11-29 ENCOUNTER — Telehealth: Payer: Self-pay | Admitting: Cardiology

## 2015-11-29 DIAGNOSIS — I442 Atrioventricular block, complete: Secondary | ICD-10-CM | POA: Diagnosis not present

## 2015-11-29 NOTE — Telephone Encounter (Signed)
LMOVM reminding pt to send remote transmission.   

## 2015-11-30 ENCOUNTER — Telehealth: Payer: Self-pay | Admitting: *Deleted

## 2015-11-30 LAB — CUP PACEART REMOTE DEVICE CHECK
Battery Remaining Percentage: 81 %
Battery Voltage: 2.93 V
Brady Statistic AP VP Percent: 12 %
Brady Statistic AS VS Percent: 1 %
Implantable Lead Implant Date: 20130917
Implantable Lead Implant Date: 20130917
Implantable Lead Model: 1948
Lead Channel Impedance Value: 560 Ohm
Lead Channel Pacing Threshold Pulse Width: 0.5 ms
Lead Channel Sensing Intrinsic Amplitude: 2.1 mV
Lead Channel Setting Pacing Pulse Width: 0.5 ms
Lead Channel Setting Sensing Sensitivity: 2 mV
MDC IDC LEAD LOCATION: 753859
MDC IDC LEAD LOCATION: 753860
MDC IDC LEAD MODEL: 1944
MDC IDC MSMT BATTERY REMAINING LONGEVITY: 110 mo
MDC IDC MSMT LEADCHNL RA PACING THRESHOLD AMPLITUDE: 0.375 V
MDC IDC MSMT LEADCHNL RA PACING THRESHOLD PULSEWIDTH: 0.5 ms
MDC IDC MSMT LEADCHNL RV IMPEDANCE VALUE: 660 Ohm
MDC IDC MSMT LEADCHNL RV PACING THRESHOLD AMPLITUDE: 0.5 V
MDC IDC PG SERIAL: 7387861
MDC IDC SESS DTM: 20170620043205
MDC IDC SET LEADCHNL RA PACING AMPLITUDE: 1.375
MDC IDC SET LEADCHNL RV PACING AMPLITUDE: 0.75 V
MDC IDC STAT BRADY AP VS PERCENT: 1 %
MDC IDC STAT BRADY AS VP PERCENT: 88 %
MDC IDC STAT BRADY RA PERCENT PACED: 12 %
MDC IDC STAT BRADY RV PERCENT PACED: 99 %

## 2015-11-30 NOTE — Progress Notes (Signed)
Remote pacemaker transmission.   

## 2015-11-30 NOTE — Telephone Encounter (Signed)
Patient called to ask if her remote transmission was received as she has been out of town.  Advised patient that we received it automatically overnight.  Reviewed transmission and advised patient that the results are WNL for her.  Patient verbalizes understanding and appreciation.  She denies questions or concerns at this time.

## 2015-12-03 ENCOUNTER — Encounter: Payer: Self-pay | Admitting: Cardiology

## 2016-02-29 ENCOUNTER — Telehealth: Payer: Self-pay | Admitting: Internal Medicine

## 2016-02-29 NOTE — Telephone Encounter (Signed)
New Message:    Pt scheduled to have an appointment with Dr Rayann Heman tomorrow. Pt has a low grade fever,coughing and a headache. She wants to know if she should keep her appontment tomorrow or reschedule?

## 2016-02-29 NOTE — Telephone Encounter (Signed)
Duplicate acct

## 2016-02-29 NOTE — Telephone Encounter (Signed)
Melissa called and rescheduled patient

## 2016-03-01 ENCOUNTER — Encounter: Payer: Medicare Other | Admitting: Internal Medicine

## 2016-03-08 ENCOUNTER — Encounter: Payer: Self-pay | Admitting: Internal Medicine

## 2016-03-08 ENCOUNTER — Ambulatory Visit (INDEPENDENT_AMBULATORY_CARE_PROVIDER_SITE_OTHER): Payer: Medicare Other | Admitting: Internal Medicine

## 2016-03-08 VITALS — BP 138/86 | HR 86 | Ht 66.5 in | Wt 149.2 lb

## 2016-03-08 DIAGNOSIS — I10 Essential (primary) hypertension: Secondary | ICD-10-CM

## 2016-03-08 DIAGNOSIS — I442 Atrioventricular block, complete: Secondary | ICD-10-CM

## 2016-03-08 DIAGNOSIS — Z950 Presence of cardiac pacemaker: Secondary | ICD-10-CM

## 2016-03-08 LAB — CUP PACEART INCLINIC DEVICE CHECK
Implantable Lead Implant Date: 20130917
Implantable Lead Model: 1944
Implantable Lead Model: 1948
MDC IDC LEAD IMPLANT DT: 20130917
MDC IDC LEAD LOCATION: 753859
MDC IDC LEAD LOCATION: 753860
MDC IDC PG SERIAL: 7387861
MDC IDC SESS DTM: 20170927090935

## 2016-03-08 NOTE — Patient Instructions (Signed)
Medication Instructions:  Your physician recommends that you continue on your current medications as directed. Please refer to the Current Medication list given to you today.   Labwork: None ordered   Testing/Procedures: None ordered   Follow-Up: Your physician wants you to follow-up in: 12 months with Chanetta Marshall, NP You will receive a reminder letter in the mail two months in advance. If you don't receive a letter, please call our office to schedule the follow-up appointment.   Remote monitoring is used to monitor your Pacemaker  from home. This monitoring reduces the number of office visits required to check your device to one time per year. It allows [place] to keep an eye on the functioning of your device to ensure it is working properly. You are scheduled for a device check from home on 06/07/16. You may send your transmission at any time that day. If you have a wireless device, the transmission will be sent automatically. After your physician reviews your transmission, you will receive a postcard with your next transmission date.   Any Other Special Instructions Will Be Listed Below (If Applicable).     If you need a refill on your cardiac medications before your next appointment, please call your pharmacy.

## 2016-03-08 NOTE — Progress Notes (Signed)
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  Since her last visit, the patient reports doing very well.  Her father recently died after prolonged illness.  She is also recovering from UTI.  Today, she denies symptoms of palpitations, chest pain, shortness of breath,  lower extremity edema, dizziness, presyncope, or syncope.  The patient is otherwise without complaint today.   Past Medical History:  Diagnosis Date  . Allergic rhinitis   . Anxiety   . Diabetes ([place])   . Exertional dyspnea 02/27/2012   "when I tried to go hiking"  . Glucose intolerance (impaired glucose tolerance)   . HLD (hyperlipidemia)   . HTN (hypertension)   . Osteoarthritis of both hips   . Pacemaker   . Pneumonia 1970's  . Pneumonia   . Second degree AV block    2:1 narrow complex QRS  . Shingles   . Skin cancer ~ 2007   "back"   Past Surgical History:  Procedure Laterality Date  . PERMANENT PACEMAKER INSERTION N/A 02/27/2012   Procedure: PERMANENT PACEMAKER INSERTION;  Surgeon: Thompson Grayer, MD;  Location: [facility] CATH LAB;  Service: Cardiovascular;  Laterality: N/A;  . SKIN CANCER EXCISION  ~ 2007  . TONSILLECTOMY AND ADENOIDECTOMY  ~ 1960  . TOTAL HIP ARTHROPLASTY Bilateral 2001; 2012   right; left    Current Outpatient Prescriptions  Medication Sig Dispense Refill  . alendronate (FOSAMAX) 70 MG tablet Take 1 tablet by mouth once a week.    . Ascorbic Acid (VITAMIN C) 1000 MG tablet Take 1,000 mg by mouth daily.    Marland Kitchen atorvastatin (LIPITOR) 10 MG tablet Take 10 mg by mouth daily.    . cetirizine (ZYRTEC) 10 MG chewable tablet Chew 10 mg by mouth daily.     Marland Kitchen CRANBERRY PO Take 1 capsule by mouth daily.     . fluticasone (FLONASE) 50 MCG/ACT nasal spray Place 2 sprays into both nostrils daily.    . hydrocortisone 1 % ointment Apply 1 application topically daily as needed for itching.     Marland Kitchen  lisinopril (PRINIVIL,ZESTRIL) 5 MG tablet Take 5 mg by mouth daily.     . metFORMIN (GLUCOPHAGE) 500 MG tablet Take 1 tablet by mouth in the morning, 1 tablet at lunch and 2 tablets at supper time    . polyethylene glycol powder (GLYCOLAX/MIRALAX) powder Take 17 g by mouth daily as needed (constipation).     . vitamin B-12 (CYANOCOBALAMIN) 1000 MCG tablet Take 1,000 mcg by mouth daily.     No current facility-administered medications for this visit.     Physical Exam: Vitals:   03/08/16 0908  BP: 138/86  Pulse: 86  Weight: 149 lb 3.2 oz (67.7 kg)  Height: 5' 6.5" (1.689 m)    GEN- The patient is well appearing, alert and oriented x 3 today.   Head- normocephalic, atraumatic Eyes-  Sclera clear, conjunctiva pink Ears- hearing intact Oropharynx- clear Lungs- Clear to ausculation bilaterally, normal work of breathing Chest- pacemaker pocket is well healed Heart- Regular rate and rhythm, no murmurs, rubs or gallops, PMI not laterally displaced GI- soft, NT, ND, + BS Extremities- no clubbing, cyanosis, or edema  Pacemaker interrogation- reviewed in detail today,  See PACEART report  Assessment and Plan:  1. Complete AV block Normal pacemaker function See Pace Art report No changes today I have discussed the available STJ firmware patch for cyberhacking with  the patient today.  We have discussed the risks associated with the firmware upgrade as well as the theoretical risk for cyberhacking.  After an informed discussion, the patient has decided not to install the firmware patch at this time.   2. HTN Stable No change required today  3. HL Stable No change required today  Merlin Return in 1 year to see EP NP  Thompson Grayer MD, [facility] 03/08/2016 9:37 AM

## 2016-06-07 ENCOUNTER — Ambulatory Visit (INDEPENDENT_AMBULATORY_CARE_PROVIDER_SITE_OTHER): Payer: Medicare Other | Admitting: *Deleted

## 2016-06-07 DIAGNOSIS — I442 Atrioventricular block, complete: Secondary | ICD-10-CM | POA: Diagnosis not present

## 2016-06-08 NOTE — Progress Notes (Signed)
Remote pacemaker transmission.   

## 2016-06-09 ENCOUNTER — Encounter: Payer: Self-pay | Admitting: Cardiology

## 2016-06-09 LAB — CUP PACEART REMOTE DEVICE CHECK
Battery Voltage: 2.93 V
Brady Statistic AP VP Percent: 8 %
Brady Statistic AS VP Percent: 92 %
Brady Statistic RA Percent Paced: 7.9 %
Date Time Interrogation Session: 20171227073731
Implantable Lead Implant Date: 20130917
Implantable Lead Implant Date: 20130917
Implantable Lead Location: 753859
Implantable Lead Model: 1944
Lead Channel Impedance Value: 530 Ohm
Lead Channel Impedance Value: 610 Ohm
Lead Channel Pacing Threshold Amplitude: 0.375 V
Lead Channel Pacing Threshold Pulse Width: 0.5 ms
Lead Channel Sensing Intrinsic Amplitude: 9.4 mV
Lead Channel Setting Pacing Amplitude: 0.75 V
Lead Channel Setting Pacing Amplitude: 1.375
MDC IDC LEAD LOCATION: 753860
MDC IDC LEAD MODEL: 1948
MDC IDC MSMT BATTERY REMAINING LONGEVITY: 108 mo
MDC IDC MSMT BATTERY REMAINING PERCENTAGE: 81 %
MDC IDC MSMT LEADCHNL RA PACING THRESHOLD PULSEWIDTH: 0.5 ms
MDC IDC MSMT LEADCHNL RA SENSING INTR AMPL: 2.2 mV
MDC IDC MSMT LEADCHNL RV PACING THRESHOLD AMPLITUDE: 0.5 V
MDC IDC PG IMPLANT DT: 20130917
MDC IDC SET LEADCHNL RV PACING PULSEWIDTH: 0.5 ms
MDC IDC SET LEADCHNL RV SENSING SENSITIVITY: 2 mV
MDC IDC STAT BRADY AP VS PERCENT: 1 %
MDC IDC STAT BRADY AS VS PERCENT: 1 %
MDC IDC STAT BRADY RV PERCENT PACED: 99 %
Pulse Gen Model: 2210
Pulse Gen Serial Number: 7387861

## 2016-06-12 DIAGNOSIS — J40 Bronchitis, not specified as acute or chronic: Secondary | ICD-10-CM

## 2016-06-12 HISTORY — DX: Bronchitis, not specified as acute or chronic: J40

## 2016-09-08 ENCOUNTER — Ambulatory Visit (INDEPENDENT_AMBULATORY_CARE_PROVIDER_SITE_OTHER): Payer: Medicare Other | Admitting: *Deleted

## 2016-09-08 DIAGNOSIS — I442 Atrioventricular block, complete: Secondary | ICD-10-CM

## 2016-09-08 NOTE — Progress Notes (Signed)
Remote pacemaker transmission.   

## 2016-09-11 ENCOUNTER — Encounter: Payer: Self-pay | Admitting: Cardiology

## 2016-09-11 LAB — CUP PACEART REMOTE DEVICE CHECK
Battery Remaining Longevity: 108 mo
Battery Remaining Percentage: 81 %
Battery Voltage: 2.93 V
Brady Statistic RA Percent Paced: 9.7 %
Brady Statistic RV Percent Paced: 99 %
Implantable Lead Implant Date: 20130917
Implantable Lead Location: 753860
Implantable Lead Model: 1944
Implantable Lead Model: 1948
Lead Channel Impedance Value: 530 Ohm
Lead Channel Pacing Threshold Amplitude: 0.375 V
Lead Channel Pacing Threshold Pulse Width: 0.5 ms
Lead Channel Sensing Intrinsic Amplitude: 2.2 mV
Lead Channel Setting Pacing Amplitude: 1.375
Lead Channel Setting Sensing Sensitivity: 2 mV
MDC IDC LEAD IMPLANT DT: 20130917
MDC IDC LEAD LOCATION: 753859
MDC IDC MSMT LEADCHNL RV IMPEDANCE VALUE: 610 Ohm
MDC IDC MSMT LEADCHNL RV PACING THRESHOLD AMPLITUDE: 0.625 V
MDC IDC MSMT LEADCHNL RV PACING THRESHOLD PULSEWIDTH: 0.5 ms
MDC IDC MSMT LEADCHNL RV SENSING INTR AMPL: 5.2 mV
MDC IDC PG IMPLANT DT: 20130917
MDC IDC PG SERIAL: 7387861
MDC IDC SESS DTM: 20180329060945
MDC IDC SET LEADCHNL RV PACING AMPLITUDE: 0.875
MDC IDC SET LEADCHNL RV PACING PULSEWIDTH: 0.5 ms
MDC IDC STAT BRADY AP VP PERCENT: 9.8 %
MDC IDC STAT BRADY AP VS PERCENT: 1 %
MDC IDC STAT BRADY AS VP PERCENT: 90 %
MDC IDC STAT BRADY AS VS PERCENT: 1 %

## 2016-12-11 ENCOUNTER — Ambulatory Visit (INDEPENDENT_AMBULATORY_CARE_PROVIDER_SITE_OTHER): Payer: Medicare Other | Admitting: *Deleted

## 2016-12-11 ENCOUNTER — Telehealth: Payer: Self-pay | Admitting: Cardiology

## 2016-12-11 DIAGNOSIS — I442 Atrioventricular block, complete: Secondary | ICD-10-CM | POA: Diagnosis not present

## 2016-12-11 NOTE — Telephone Encounter (Signed)
Spoke with pt and reminded pt of remote transmission that is due today. Pt verbalized understanding.   

## 2016-12-12 ENCOUNTER — Encounter: Payer: Self-pay | Admitting: Cardiology

## 2016-12-12 LAB — CUP PACEART REMOTE DEVICE CHECK
Brady Statistic AP VP Percent: 9.8 %
Brady Statistic AP VS Percent: 1 %
Brady Statistic AS VP Percent: 90 %
Brady Statistic RV Percent Paced: 99 %
Implantable Lead Implant Date: 20130917
Implantable Lead Location: 753859
Implantable Lead Model: 1944
Implantable Lead Model: 1948
Lead Channel Impedance Value: 600 Ohm
Lead Channel Pacing Threshold Amplitude: 0.5 V
Lead Channel Pacing Threshold Pulse Width: 0.5 ms
Lead Channel Sensing Intrinsic Amplitude: 5.2 mV
Lead Channel Setting Pacing Amplitude: 0.75 V
Lead Channel Setting Pacing Amplitude: 1.375
Lead Channel Setting Pacing Pulse Width: 0.5 ms
Lead Channel Setting Sensing Sensitivity: 2 mV
MDC IDC LEAD IMPLANT DT: 20130917
MDC IDC LEAD LOCATION: 753860
MDC IDC MSMT BATTERY REMAINING LONGEVITY: 97 mo
MDC IDC MSMT BATTERY REMAINING PERCENTAGE: 73 %
MDC IDC MSMT BATTERY VOLTAGE: 2.92 V
MDC IDC MSMT LEADCHNL RA IMPEDANCE VALUE: 530 Ohm
MDC IDC MSMT LEADCHNL RA PACING THRESHOLD AMPLITUDE: 0.375 V
MDC IDC MSMT LEADCHNL RA PACING THRESHOLD PULSEWIDTH: 0.5 ms
MDC IDC MSMT LEADCHNL RA SENSING INTR AMPL: 2.1 mV
MDC IDC PG IMPLANT DT: 20130917
MDC IDC SESS DTM: 20180702084332
MDC IDC STAT BRADY AS VS PERCENT: 1 %
MDC IDC STAT BRADY RA PERCENT PACED: 9.6 %
Pulse Gen Serial Number: 7387861

## 2016-12-12 NOTE — Progress Notes (Signed)
Remote pacemaker transmission.   

## 2017-02-21 NOTE — Progress Notes (Signed)
Electrophysiology Office Note Date: 02/22/2017  ID:  Lauren Norton, DOB 1946/01/09, MRN [id number]  PCP: Prince Solian, MD Primary Cardiologist: Irish Lack Electrophysiologist: Allred  CC: Pacemaker follow-up  Lauren Norton is a 71 y.o. female seen today for Dr Rayann Heman.  She presents today for routine electrophysiology followup.  Since last being seen in our clinic, the patient reports doing very well. She has a blood pressure management regimen that is working well.  She doesn't sleep well and sometimes wakes up fatigued. Her husband says she snores.  She denies chest pain, palpitations, dyspnea, PND, orthopnea, nausea, vomiting, dizziness, syncope, edema, weight gain, or early satiety.  Device History: STJ dual chamber PPM implanted 2013 for Mobitz II   Past Medical History:  Diagnosis Date  . Anxiety   . Diabetes ([place])   . HLD (hyperlipidemia)   . HTN (hypertension)   . Osteoarthritis of both hips   . Second degree AV block    a.s/p MDT dual chamber PPM   . Shingles   . Skin cancer ~ 2007   "back"   Past Surgical History:  Procedure Laterality Date  . PERMANENT PACEMAKER INSERTION N/A 02/27/2012   a. MDT dual chamber PPM implanted by Dr Rayann Heman for Mobitz II  . SKIN CANCER EXCISION  ~ 2007  . TONSILLECTOMY AND ADENOIDECTOMY  ~ 1960  . TOTAL HIP ARTHROPLASTY Bilateral 2001; 2012   right; left    Current Outpatient Prescriptions  Medication Sig Dispense Refill  . alendronate (FOSAMAX) 70 MG tablet Take 1 tablet by mouth once a week.    . Ascorbic Acid (VITAMIN C) 1000 MG tablet Take 1,000 mg by mouth daily.    Marland Kitchen atorvastatin (LIPITOR) 10 MG tablet Take 10 mg by mouth daily.    . cetirizine (ZYRTEC) 10 MG chewable tablet Chew 10 mg by mouth daily.     Marland Kitchen CRANBERRY PO Take 1 capsule by mouth daily.     . hydrocortisone 1 % ointment Apply 1 application topically daily as needed for itching.     Marland Kitchen lisinopril (PRINIVIL,ZESTRIL) 2.5 MG tablet Take 2.5 mg by mouth  daily.    . metFORMIN (GLUCOPHAGE) 500 MG tablet Take 1 tablet by mouth in the morning, 1 tablet at lunch and 2 tablets at supper time    . polyethylene glycol powder (GLYCOLAX/MIRALAX) powder Take 17 g by mouth daily as needed (constipation).     . vitamin B-12 (CYANOCOBALAMIN) 1000 MCG tablet Take 1,000 mcg by mouth daily.     No current facility-administered medications for this visit.     Allergies:   Sulfa antibiotics   Social History: Social History   Social History  . Marital status: Married    Spouse name: N/A  . Number of children: 3  . Years of education: N/A   Occupational History  . retired Pharmacist, hospital    Social History Main Topics  . Smoking status: Never Smoker  . Smokeless tobacco: Never Used  . Alcohol use 4.2 oz/week    5 Glasses of wine, 2 Standard drinks or equivalent per week     Comment: glass of wine ~ 5 nights/wk; margarita q Fri night"  . Drug use: No  . Sexual activity: Yes   Other Topics Concern  . Not on file   Social History Narrative   Pt lives in [place].  Retired.    Family History: Family History  Problem Relation Age of Onset  . Heart attack Father   . Diabetes Father   .  Prostate cancer Father   . CAD Father   . Hypertension Father   . Heart disease Maternal Grandmother   . Alzheimer's disease Mother      Review of Systems: All other systems reviewed and are otherwise negative except as noted above.   Physical Exam: VS:  BP 140/70   Pulse 96   Ht 5\' 6"  (1.676 m)   Wt 142 lb (64.4 kg)   SpO2 98%   BMI 22.92 kg/m  , BMI Body mass index is 22.92 kg/m.  GEN- The patient is well appearing, alert and oriented x 3 today.   HEENT: normocephalic, atraumatic; sclera clear, conjunctiva pink; hearing intact; oropharynx clear; neck supple  Lungs- Clear to ausculation bilaterally, normal work of breathing.  No wheezes, rales, rhonchi Heart- Regular rate and rhythm  GI- soft, non-tender, non-distended, bowel sounds present    Extremities- no clubbing, cyanosis, or edema  MS- no significant deformity or atrophy Skin- warm and dry, no rash or lesion; PPM pocket well healed Psych- euthymic mood, full affect Neuro- strength and sensation are intact  PPM Interrogation- reviewed in detail today,  See PACEART report  EKG:  EKG is not ordered today.  Recent Labs: No results found for requested labs within last 8760 hours.   Wt Readings from Last 3 Encounters:  02/22/17 142 lb (64.4 kg)  03/08/16 149 lb 3.2 oz (67.7 kg)  03/01/15 149 lb 12.8 oz (67.9 kg)     Other studies Reviewed: Additional studies/ records that were reviewed today include: Dr Rayann Heman and Dr Hassell Done office notes   Assessment and Plan:  1.  Complete heart block  Normal PPM function See Pace Art report No changes today  2.  HTN Stable No change required today  3.  Sleep disordered breathing She will discuss with her PCP having a sleep study done   Current medicines are reviewed at length with the patient today.   The patient does not have concerns regarding her medicines.  The following changes were made today:  none  Labs/ tests ordered today include: none No orders of the defined types were placed in this encounter.    Disposition:   Follow up with Delilah Shan, Dr Rayann Heman 1 year   Signed, Chanetta Marshall, NP 02/22/2017 10:49 AM  [facility] [address] [place] [address] [phone] (office) [phone] (fax)

## 2017-02-22 ENCOUNTER — Encounter (INDEPENDENT_AMBULATORY_CARE_PROVIDER_SITE_OTHER): Payer: Self-pay

## 2017-02-22 ENCOUNTER — Ambulatory Visit (INDEPENDENT_AMBULATORY_CARE_PROVIDER_SITE_OTHER): Payer: Medicare Other | Admitting: Nurse Practitioner

## 2017-02-22 ENCOUNTER — Encounter: Payer: Self-pay | Admitting: Nurse Practitioner

## 2017-02-22 VITALS — BP 140/70 | HR 96 | Ht 66.0 in | Wt 142.0 lb

## 2017-02-22 DIAGNOSIS — I10 Essential (primary) hypertension: Secondary | ICD-10-CM

## 2017-02-22 DIAGNOSIS — I442 Atrioventricular block, complete: Secondary | ICD-10-CM | POA: Diagnosis not present

## 2017-02-22 NOTE — Patient Instructions (Addendum)
Medication Instructions:   Your physician recommends that you continue on your current medications as directed. Please refer to the Current Medication list given to you today.   If you need a refill on your cardiac medications before your next appointment, please call your pharmacy.  Labwork: NONE ORDERED  TODAY    Testing/Procedures:  NONE ORDERED  TODAY    Follow-Up:  Your physician wants you to follow-up in: [place] will receive a reminder letter in the mail two months in advance. If you don't receive a letter, please call our office to schedule the follow-up appointment.      Remote monitoring is used to monitor your Pacemaker of ICD from home. This monitoring reduces the number of office visits required to check your device to one time per year. It allows [place] to keep an eye on the functioning of your device to ensure it is working properly. You are scheduled for a device check from home on...05-24-17 You may send your transmission at any time that day. If you have a wireless device, the transmission will be sent automatically. After your physician reviews your transmission, you will receive a postcard with your next transmission date.     Any Other Special Instructions Will Be Listed Below (If Applicable).

## 2017-05-24 ENCOUNTER — Ambulatory Visit (INDEPENDENT_AMBULATORY_CARE_PROVIDER_SITE_OTHER): Payer: Medicare Other | Admitting: *Deleted

## 2017-05-24 DIAGNOSIS — I442 Atrioventricular block, complete: Secondary | ICD-10-CM

## 2017-05-24 NOTE — Progress Notes (Signed)
Remote pacemaker transmission.   

## 2017-05-29 ENCOUNTER — Encounter: Payer: Self-pay | Admitting: Cardiology

## 2017-06-15 LAB — CUP PACEART REMOTE DEVICE CHECK
Battery Remaining Longevity: 83 mo
Battery Remaining Percentage: 65 %
Battery Voltage: 2.9 V
Brady Statistic AP VP Percent: 30 %
Brady Statistic AP VS Percent: 1 %
Brady Statistic AS VP Percent: 63 %
Brady Statistic AS VS Percent: 1 %
Brady Statistic RA Percent Paced: 22 %
Brady Statistic RV Percent Paced: 93 %
Date Time Interrogation Session: 20181213093905
Implantable Lead Implant Date: 20130917
Implantable Lead Implant Date: 20130917
Implantable Lead Location: 753859
Implantable Lead Location: 753860
Implantable Lead Model: 1944
Implantable Lead Model: 1948
Implantable Pulse Generator Implant Date: 20130917
Lead Channel Impedance Value: 580 Ohm
Lead Channel Impedance Value: 640 Ohm
Lead Channel Pacing Threshold Amplitude: 0.375 V
Lead Channel Pacing Threshold Amplitude: 0.5 V
Lead Channel Pacing Threshold Pulse Width: 0.5 ms
Lead Channel Pacing Threshold Pulse Width: 0.5 ms
Lead Channel Sensing Intrinsic Amplitude: 12 mV
Lead Channel Sensing Intrinsic Amplitude: 3.6 mV
Lead Channel Setting Pacing Amplitude: 0.75 V
Lead Channel Setting Pacing Amplitude: 1.375
Lead Channel Setting Pacing Pulse Width: 0.5 ms
Lead Channel Setting Sensing Sensitivity: 2 mV
Pulse Gen Model: 2210
Pulse Gen Serial Number: 7387861

## 2017-08-23 ENCOUNTER — Ambulatory Visit (INDEPENDENT_AMBULATORY_CARE_PROVIDER_SITE_OTHER): Payer: Medicare Other | Admitting: *Deleted

## 2017-08-23 DIAGNOSIS — I442 Atrioventricular block, complete: Secondary | ICD-10-CM

## 2017-08-23 NOTE — Progress Notes (Signed)
Remote pacemaker transmission.   

## 2017-08-24 ENCOUNTER — Encounter: Payer: Self-pay | Admitting: Cardiology

## 2017-09-09 LAB — CUP PACEART REMOTE DEVICE CHECK
Battery Remaining Percentage: 65 %
Battery Voltage: 2.9 V
Brady Statistic AP VP Percent: 25 %
Brady Statistic RV Percent Paced: 95 %
Date Time Interrogation Session: 20190314080756
Implantable Lead Implant Date: 20130917
Implantable Lead Model: 1944
Implantable Pulse Generator Implant Date: 20130917
Lead Channel Impedance Value: 610 Ohm
Lead Channel Pacing Threshold Amplitude: 0.5 V
Lead Channel Sensing Intrinsic Amplitude: 1.3 mV
Lead Channel Setting Pacing Pulse Width: 0.5 ms
Lead Channel Setting Sensing Sensitivity: 2 mV
MDC IDC LEAD IMPLANT DT: 20130917
MDC IDC LEAD LOCATION: 753859
MDC IDC LEAD LOCATION: 753860
MDC IDC MSMT BATTERY REMAINING LONGEVITY: 84 mo
MDC IDC MSMT LEADCHNL RA IMPEDANCE VALUE: 530 Ohm
MDC IDC MSMT LEADCHNL RA PACING THRESHOLD AMPLITUDE: 0.375 V
MDC IDC MSMT LEADCHNL RA PACING THRESHOLD PULSEWIDTH: 0.5 ms
MDC IDC MSMT LEADCHNL RV PACING THRESHOLD PULSEWIDTH: 0.5 ms
MDC IDC MSMT LEADCHNL RV SENSING INTR AMPL: 12 mV
MDC IDC PG SERIAL: 7387861
MDC IDC SET LEADCHNL RA PACING AMPLITUDE: 1.375
MDC IDC SET LEADCHNL RV PACING AMPLITUDE: 0.75 V
MDC IDC STAT BRADY AP VS PERCENT: 1 %
MDC IDC STAT BRADY AS VP PERCENT: 70 %
MDC IDC STAT BRADY AS VS PERCENT: 1 %
MDC IDC STAT BRADY RA PERCENT PACED: 19 %

## 2017-11-22 ENCOUNTER — Ambulatory Visit (INDEPENDENT_AMBULATORY_CARE_PROVIDER_SITE_OTHER): Payer: Medicare Other | Admitting: *Deleted

## 2017-11-22 ENCOUNTER — Telehealth: Payer: Self-pay | Admitting: Cardiology

## 2017-11-22 DIAGNOSIS — I10 Essential (primary) hypertension: Secondary | ICD-10-CM

## 2017-11-22 DIAGNOSIS — I442 Atrioventricular block, complete: Secondary | ICD-10-CM

## 2017-11-22 NOTE — Telephone Encounter (Signed)
LMOVM reminding pt to send remote transmission.   

## 2017-11-23 NOTE — Progress Notes (Signed)
Remote pacemaker transmission.   

## 2017-12-14 LAB — CUP PACEART REMOTE DEVICE CHECK
Battery Remaining Longevity: 84 mo
Battery Remaining Percentage: 65 %
Brady Statistic AP VS Percent: 1 %
Brady Statistic AS VP Percent: 74 %
Brady Statistic AS VS Percent: 1 %
Brady Statistic RV Percent Paced: 96 %
Implantable Lead Implant Date: 20130917
Implantable Lead Location: 753860
Implantable Lead Model: 1948
Lead Channel Impedance Value: 530 Ohm
Lead Channel Pacing Threshold Amplitude: 0.5 V
Lead Channel Pacing Threshold Pulse Width: 0.5 ms
Lead Channel Sensing Intrinsic Amplitude: 2.7 mV
Lead Channel Setting Pacing Pulse Width: 0.5 ms
Lead Channel Setting Sensing Sensitivity: 2 mV
MDC IDC LEAD IMPLANT DT: 20130917
MDC IDC LEAD LOCATION: 753859
MDC IDC MSMT BATTERY VOLTAGE: 2.9 V
MDC IDC MSMT LEADCHNL RA PACING THRESHOLD AMPLITUDE: 0.25 V
MDC IDC MSMT LEADCHNL RA PACING THRESHOLD PULSEWIDTH: 0.5 ms
MDC IDC MSMT LEADCHNL RV IMPEDANCE VALUE: 630 Ohm
MDC IDC MSMT LEADCHNL RV SENSING INTR AMPL: 12 mV
MDC IDC PG IMPLANT DT: 20130917
MDC IDC PG SERIAL: 7387861
MDC IDC SESS DTM: 20190614064246
MDC IDC SET LEADCHNL RA PACING AMPLITUDE: 1.25 V
MDC IDC SET LEADCHNL RV PACING AMPLITUDE: 0.75 V
MDC IDC STAT BRADY AP VP PERCENT: 22 %
MDC IDC STAT BRADY RA PERCENT PACED: 17 %

## 2018-01-14 NOTE — Patient Instructions (Addendum)
Your procedure is scheduled on: Tuesday January 29, 2018 at 7:30 am  Enter through the Main Entrance of [facility] at:6:00 am  Pick up the phone at the desk and dial [phone].  Call this number if you have problems the morning of surgery: [phone] Remember: Do NOT eat food or drink any liquids after: Midnight on Monday August 19  Take these medicines the morning of surgery with a SIP OF WATER: Lisinopril  HOLD METFORMIN FOR 24 HOURS PRIOR TO SURGERY   STOP ALL VITAMINS, SUPPLEMENTS, HERBAL MEDICATIONS NOW  DO NOT SMOKE DAY OF SURGERY  BRUSH YOUR TEETH DAY OF SURGERY  Do NOT wear jewelry (body piercing), metal hair clips/bobby pins, make-up, or nail polish. Do NOT wear lotions, powders, or perfumes.  You may wear deoderant. Do NOT shave for 48 hours prior to surgery. Do NOT bring valuables to the hospital. Contacts, dentures, or bridgework may not be worn into surgery. Leave suitcase in car.  After surgery it may be brought to your room.    For patients admitted to the hospital, checkout time is 11:00 AM the day of discharge.

## 2018-01-15 ENCOUNTER — Telehealth: Payer: Self-pay | Admitting: Internal Medicine

## 2018-01-15 NOTE — Telephone Encounter (Signed)
No message needed °

## 2018-01-16 ENCOUNTER — Other Ambulatory Visit: Payer: Self-pay

## 2018-01-16 ENCOUNTER — Encounter (HOSPITAL_COMMUNITY)
Admission: RE | Admit: 2018-01-16 | Discharge: 2018-01-16 | Disposition: A | Discharge status: Home / Self Care | Payer: Medicare Other | Source: Ambulatory Visit | Attending: Obstetrics and Gynecology | Admitting: Obstetrics and Gynecology

## 2018-01-16 ENCOUNTER — Encounter (HOSPITAL_COMMUNITY): Payer: Self-pay

## 2018-01-16 ENCOUNTER — Encounter: Payer: Self-pay | Admitting: Internal Medicine

## 2018-01-16 DIAGNOSIS — R9431 Abnormal electrocardiogram [ECG] [EKG]: Secondary | ICD-10-CM | POA: Diagnosis not present

## 2018-01-16 DIAGNOSIS — Z950 Presence of cardiac pacemaker: Secondary | ICD-10-CM | POA: Insufficient documentation

## 2018-01-16 DIAGNOSIS — Z01818 Encounter for other preprocedural examination: Secondary | ICD-10-CM | POA: Diagnosis present

## 2018-01-16 DIAGNOSIS — Z01812 Encounter for preprocedural laboratory examination: Secondary | ICD-10-CM | POA: Diagnosis not present

## 2018-01-16 DIAGNOSIS — Z0183 Encounter for blood typing: Secondary | ICD-10-CM | POA: Insufficient documentation

## 2018-01-16 HISTORY — DX: Bronchitis, not specified as acute or chronic: J40

## 2018-01-16 HISTORY — DX: Presence of cardiac pacemaker: Z95.0

## 2018-01-16 HISTORY — DX: Unspecified cataract: H26.9

## 2018-01-16 LAB — COMPREHENSIVE METABOLIC PANEL
ALBUMIN: 4.8 g/dL (ref 3.5–5.0)
ALT: 22 U/L (ref 0–44)
ANION GAP: 12 (ref 5–15)
AST: 26 U/L (ref 15–41)
Alkaline Phosphatase: 60 U/L (ref 38–126)
BUN: 27 mg/dL — ABNORMAL HIGH (ref 8–23)
CO2: 25 mmol/L (ref 22–32)
Calcium: 10.4 mg/dL — ABNORMAL HIGH (ref 8.9–10.3)
Chloride: 101 mmol/L (ref 98–111)
Creatinine, Ser: 0.77 mg/dL (ref 0.44–1.00)
GFR calc Af Amer: >60 mL/min (ref >60)
GFR calc non Af Amer: >60 mL/min (ref >60)
GLUCOSE: 123 mg/dL — AB (ref 70–99)
POTASSIUM: 4.2 mmol/L (ref 3.5–5.1)
SODIUM: 138 mmol/L (ref 135–145)
Total Bilirubin: 0.7 mg/dL (ref 0.3–1.2)
Total Protein: 7.7 g/dL (ref 6.5–8.1)

## 2018-01-16 LAB — TYPE AND SCREEN
ABO/RH(D): O NEG
Antibody Screen: NEGATIVE

## 2018-01-16 LAB — CBC
HCT: 42.1 % (ref 36.0–46.0)
Hemoglobin: 13.9 g/dL (ref 12.0–15.0)
MCH: 29.5 pg (ref 26.0–34.0)
MCHC: 33 g/dL (ref 30.0–36.0)
MCV: 89.4 fL (ref 78.0–100.0)
PLATELETS: 261 10*3/uL (ref 150–400)
RBC: 4.71 MIL/uL (ref 3.87–5.11)
RDW: 13.6 % (ref 11.5–15.5)
WBC: 7.6 10*3/uL (ref 4.0–10.5)

## 2018-01-16 LAB — ABO/RH: ABO/RH(D): O NEG

## 2018-01-16 NOTE — Pre-Procedure Instructions (Signed)
Dr. Gifford Shave viewed EKG, aware of history of HTN, Pacemaker, diabetes. Will send for information on pacemaker to DR. Allred today

## 2018-01-21 ENCOUNTER — Telehealth: Payer: Self-pay | Admitting: Internal Medicine

## 2018-01-21 NOTE — Telephone Encounter (Signed)
New Message    Received preop clearance paper work.        [facility] Pre-operative Risk Assessment    Request for surgical clearance:  1. What type of surgery is being performed? Total vaginal hysteroctomy possible bilateral salpingoophenectoy cystoscopy. anterion and posterior repair  2. When is this surgery scheduled? 01/29/2018   3. What type of clearance is required (medical clearance vs. Pharmacy clearance to hold med vs. Both)? Medical   4. Are there any medications that need to be held prior to surgery and how long? none indicated  5. Practice name and name of physician performing surgery? [facility]  Dr. Paula Compton   6. What is your office phone number [phone]   7.   What is your office fax number [phone]  8.   Anesthesia type (None, local, MAC, general) ?    Lauren Norton 01/21/2018, 3:23 PM  _________________________________________________________________   (provider comments below)

## 2018-01-21 NOTE — Telephone Encounter (Signed)
   Primary Cardiologist: Allred/Seiler Chart reviewed as part of pre-operative protocol coverage. Because of Lauren Norton ZGFUQ'[id number] past medical history and time since last visit, he/she will require a follow-up visit in order to better assess preoperative cardiovascular risk.  Pre-op covering staff: - Please schedule appointment and call patient to inform them. - Please contact requesting surgeon's office via preferred method (i.e, phone, fax) to inform them of need for appointment prior to surgery.  [name], NP  01/21/2018, 4:19 PM

## 2018-01-22 ENCOUNTER — Telehealth: Payer: Self-pay | Admitting: Internal Medicine

## 2018-01-22 NOTE — Telephone Encounter (Signed)
Appt made with Dr. Rayann Heman for 01/28/2018 for surgical clearance no 8/20

## 2018-01-22 NOTE — Telephone Encounter (Signed)
Patient has an appt with Dr Allredd on 01/28/2018 at 3:30pm. Patient is aware of appointment.

## 2018-01-22 NOTE — Telephone Encounter (Signed)
New Message   Pt states she is suppose to have her surgery on 8/20 but was denied clearance due to needing an appt. Offered an appt with Ursuy for 02/13/18 but pt states that is unacceptable and it needs to be before her surgery and wants to know if Allred can work anything out. Please call

## 2018-01-28 ENCOUNTER — Ambulatory Visit (INDEPENDENT_AMBULATORY_CARE_PROVIDER_SITE_OTHER): Payer: Medicare Other | Admitting: Internal Medicine

## 2018-01-28 ENCOUNTER — Encounter: Payer: Self-pay | Admitting: Internal Medicine

## 2018-01-28 VITALS — BP 136/84 | HR 92 | Ht 66.0 in | Wt 139.6 lb

## 2018-01-28 DIAGNOSIS — I442 Atrioventricular block, complete: Secondary | ICD-10-CM | POA: Diagnosis not present

## 2018-01-28 DIAGNOSIS — I10 Essential (primary) hypertension: Secondary | ICD-10-CM | POA: Diagnosis not present

## 2018-01-28 DIAGNOSIS — Z950 Presence of cardiac pacemaker: Secondary | ICD-10-CM | POA: Diagnosis not present

## 2018-01-28 LAB — CUP PACEART INCLINIC DEVICE CHECK
Battery Voltage: 2.9 V
Brady Statistic RA Percent Paced: 17 %
Implantable Lead Implant Date: 20130917
Implantable Lead Location: 753859
Implantable Lead Location: 753860
Implantable Lead Model: 1944
Implantable Lead Model: 1948
Lead Channel Impedance Value: 650 Ohm
Lead Channel Impedance Value: 725 Ohm
Lead Channel Pacing Threshold Amplitude: 0.5 V
Lead Channel Pacing Threshold Pulse Width: 0.5 ms
Lead Channel Pacing Threshold Pulse Width: 0.5 ms
Lead Channel Sensing Intrinsic Amplitude: 4.4 mV
Lead Channel Setting Pacing Amplitude: 2 V
MDC IDC LEAD IMPLANT DT: 20130917
MDC IDC MSMT BATTERY REMAINING LONGEVITY: 81 mo
MDC IDC MSMT LEADCHNL RV PACING THRESHOLD AMPLITUDE: 0.75 V
MDC IDC MSMT LEADCHNL RV SENSING INTR AMPL: 10.3 mV
MDC IDC PG IMPLANT DT: 20130917
MDC IDC SESS DTM: 20190819181636
MDC IDC SET LEADCHNL RV PACING AMPLITUDE: 0.75 V
MDC IDC SET LEADCHNL RV PACING PULSEWIDTH: 0.5 ms
MDC IDC SET LEADCHNL RV SENSING SENSITIVITY: 2 mV
MDC IDC STAT BRADY RV PERCENT PACED: 96 %
Pulse Gen Serial Number: 7387861

## 2018-01-28 NOTE — H&P (Signed)
Lauren Norton is an 72 y.o. female G3P3 who presents for a scheduled vaginal hysterectomy with bilateral salpingectomies for plevic prolapse.  The patient has had symptoms from her prolapse for several years and has managed fairly well with a pessary, but is now seeking definitive surgical therapy as it is somewhat cumbersome with her sex life with husband.  She has only mild SUI.   She has an implanted pacemaker and has been cleared by cardiology. She has CHTN which is well-controlled.   Pertinent Gynecological History: OB History: NSVD x 3 of 9-10 lb babies   Menstrual History:  No LMP recorded. Patient is postmenopausal.    Past Medical History:  Diagnosis Date  . Anxiety   . Bronchitis 2018  . Cataract 2017   both eyes  . Diabetes ([place])   . HLD (hyperlipidemia)   . HTN (hypertension)   . Osteoarthritis of both hips    both hips replaced  . Pneumonia    many years ago  . Presence of permanent cardiac pacemaker    2013  . Second degree AV block    a.s/p MDT dual chamber PPM   . Shingles   . Skin cancer ~ 2007   "back"    Past Surgical History:  Procedure Laterality Date  . CYST REMOVAL NECK  2016  . JOINT REPLACEMENT Bilateral    bilateral hip replacements  . left elbow fracture     age 67  . PERMANENT PACEMAKER INSERTION N/A 02/27/2012   a. MDT dual chamber PPM implanted by Dr Rayann Heman for Mobitz II  . SKIN CANCER EXCISION  ~ 2007  . TONSILLECTOMY AND ADENOIDECTOMY  ~ 1960  . TOTAL HIP ARTHROPLASTY Bilateral 2001; 2012   right; left    Family History  Problem Relation Age of Onset  . Heart attack Father   . Diabetes Father   . Prostate cancer Father   . CAD Father   . Hypertension Father   . Heart disease Maternal Grandmother   . Alzheimer's disease Mother     Social History:  reports that she has never smoked. She has never used smokeless tobacco. She reports that she drinks about 6.0 standard drinks of alcohol per week. She reports that she does not  use drugs.  Allergies:  Allergies  Allergen Reactions  . Sulfa Antibiotics Itching    No medications prior to admission.    Review of Systems  Gastrointestinal: Negative for abdominal pain and constipation.  Genitourinary: Negative for frequency.    There were no vitals taken for this visit. Physical Exam  Constitutional: She appears well-developed and well-nourished.  Cardiovascular: Normal rate and regular rhythm.  Respiratory: Effort normal and breath sounds normal.  GI: Soft.  Genitourinary:  Genitourinary Comments: Grade 2-3 cystocele and uterine prolapse Small uterus Grade 1-2 rectocele    No results found for this or any previous visit (from the past 24 hour(s)).  No results found.  Assessment/Plan: The patient was counseled regarding the risks of vaginal hysterectomy with anterior and posterior repair. The procedure was reviewed in detail and expectations regarding recovery. Risks of bleeding, infection and possible damage to bowel and bladder were reviewed. The patient understands that should a complication arise she would likely need an abdominal incision and that this would delay her recovery. She would accept a blood transfusion if needed. We also discussed removal of the fallopian tubes and ovaries as a means of possibly reducing future risk of ovarian cancer and she is agreeable to this  if they are visible, we will not struggle to remove them. She is ready to proceed.  Logan Bores 01/28/2018, 10:04 PM

## 2018-01-28 NOTE — Patient Instructions (Signed)
Medication Instructions:  Your physician recommends that you continue on your current medications as directed. Please refer to the Current Medication list given to you today.  Labwork: None ordered.  Testing/Procedures: None ordered.  Follow-Up: Your physician wants you to follow-up in: one year with Chanetta Marshall, NP.   You will receive a reminder letter in the mail two months in advance. If you don't receive a letter, please call our office to schedule the follow-up appointment.  Remote monitoring is used to monitor your Pacemaker from home. This monitoring reduces the number of office visits required to check your device to one time per year. It allows [place] to keep an eye on the functioning of your device to ensure it is working properly. You are scheduled for a device check from home on 02/21/2018. You may send your transmission at any time that day. If you have a wireless device, the transmission will be sent automatically. After your physician reviews your transmission, you will receive a postcard with your next transmission date.  Any Other Special Instructions Will Be Listed Below (If Applicable).  If you need a refill on your cardiac medications before your next appointment, please call your pharmacy.

## 2018-01-28 NOTE — Progress Notes (Signed)
PCP: Prince Solian, MD Primary Cardiologist: Dr Irish Lack Primary EP:  Dr Rayann Heman  Lauren Norton is a 72 y.o. female who presents today for routine electrophysiology followup.  Since last being seen in our clinic, the patient reports doing very well. She has had issues with uterine prolapse and is planned to have TAH tomorrow. Today, she denies symptoms of palpitations, chest pain, shortness of breath,  lower extremity edema, dizziness, presyncope, or syncope.  The patient is otherwise without complaint today.  She is very active, without ischemic symptoms.  Past Medical History:  Diagnosis Date  . Anxiety   . Bronchitis 2018  . Cataract 2017   both eyes  . Diabetes ([place])   . HLD (hyperlipidemia)   . HTN (hypertension)   . Osteoarthritis of both hips    both hips replaced  . Pneumonia    many years ago  . Presence of permanent cardiac pacemaker    2013  . Second degree AV block    a.s/p MDT dual chamber PPM   . Shingles   . Skin cancer ~ 2007   "back"   Past Surgical History:  Procedure Laterality Date  . CYST REMOVAL NECK  2016  . JOINT REPLACEMENT Bilateral    bilateral hip replacements  . left elbow fracture     age 56  . PERMANENT PACEMAKER INSERTION N/A 02/27/2012   a. MDT dual chamber PPM implanted by Dr Rayann Heman for Mobitz II  . SKIN CANCER EXCISION  ~ 2007  . TONSILLECTOMY AND ADENOIDECTOMY  ~ 1960  . TOTAL HIP ARTHROPLASTY Bilateral 2001; 2012   right; left    ROS- all systems are reviewed and negative except as per HPI above  Current Outpatient Medications  Medication Sig Dispense Refill  . alendronate (FOSAMAX) 70 MG tablet Take 70 mg by mouth once a week. Tuesdays    . Amoxicillin (MOXATAG PO) Take 4 tablets by mouth as needed.    Marland Kitchen atorvastatin (LIPITOR) 10 MG tablet Take 10 mg by mouth daily with supper.     . Biotin 1000 MCG tablet Take 1,000 mcg by mouth 3 (three) times daily.    . calcium citrate-vitamin D (CITRACAL+D) 315-200 MG-UNIT tablet  Take 1 tablet by mouth 2 (two) times daily.    . cetirizine (ZYRTEC) 10 MG tablet Take 5 mg by mouth at bedtime as needed for allergies.     Marland Kitchen CRANBERRY PO Take 650 mg by mouth 2 (two) times daily.     . Glucosamine-Chondroit-Vit C-Mn (GLUCOSAMINE CHONDR 1500 COMPLX) CAPS Take 2 capsules by mouth daily.    Marland Kitchen lisinopril (PRINIVIL,ZESTRIL) 2.5 MG tablet Take 2.5 mg by mouth 2 (two) times daily.     . metFORMIN (GLUCOPHAGE) 500 MG tablet Take 500-1,000 mg by mouth See admin instructions. Take 500mg  by mouth in the morning, 500mg  at lunch and 1000mg  at supper time    . polyethylene glycol powder (GLYCOLAX/MIRALAX) powder Take 17 g by mouth every other day.      No current facility-administered medications for this visit.     Physical Exam: Vitals:   01/28/18 1555  BP: 136/84  Pulse: 92  SpO2: 99%  Weight: 139 lb 9.6 oz (63.3 kg)  Height: 5\' 6"  (1.676 m)    GEN- The patient is well appearing, alert and oriented x 3 today.   Head- normocephalic, atraumatic Eyes-  Sclera clear, conjunctiva pink Ears- hearing intact Oropharynx- clear Lungs- Clear to ausculation bilaterally, normal work of breathing Chest- pacemaker pocket  is well healed Heart- Regular rate and rhythm, no murmurs, rubs or gallops, PMI not laterally displaced GI- soft, NT, ND, + BS Extremities- no clubbing, cyanosis, or edema  Pacemaker interrogation- reviewed in detail today,  See PACEART report  ekg tracing ordered today is personally reviewed and shows sinus rhythm with V pacing  Assessment and Plan:  1. Symptomatic complete heart block Normal pacemaker function See Pace Art report No changes today  2. HTN Stable No change required today  3. preop She is planned for hysterectomy  No ischemic symptoms Ok to proceed with surgery as planned without further CV testing.  Merlin Return to see EP NP yearly  Thompson Grayer MD, [facility] 01/28/2018 4:16 PM

## 2018-01-29 ENCOUNTER — Ambulatory Visit (HOSPITAL_COMMUNITY): Payer: Medicare Other | Admitting: Anesthesiology

## 2018-01-29 ENCOUNTER — Ambulatory Visit (HOSPITAL_COMMUNITY)
Admission: RE | Admit: 2018-01-29 | Discharge: 2018-01-30 | Disposition: A | Discharge status: Home / Self Care | Payer: Medicare Other | Source: Ambulatory Visit | Attending: Obstetrics and Gynecology | Admitting: Obstetrics and Gynecology

## 2018-01-29 ENCOUNTER — Encounter (HOSPITAL_COMMUNITY): Payer: Self-pay

## 2018-01-29 ENCOUNTER — Other Ambulatory Visit: Payer: Self-pay

## 2018-01-29 ENCOUNTER — Encounter (HOSPITAL_COMMUNITY)
Admission: RE | Disposition: A | Discharge status: Home / Self Care | Payer: Self-pay | Source: Ambulatory Visit | Attending: Obstetrics and Gynecology

## 2018-01-29 DIAGNOSIS — Z79899 Other long term (current) drug therapy: Secondary | ICD-10-CM | POA: Diagnosis not present

## 2018-01-29 DIAGNOSIS — Z882 Allergy status to sulfonamides status: Secondary | ICD-10-CM | POA: Insufficient documentation

## 2018-01-29 DIAGNOSIS — D259 Leiomyoma of uterus, unspecified: Secondary | ICD-10-CM | POA: Insufficient documentation

## 2018-01-29 DIAGNOSIS — I10 Essential (primary) hypertension: Secondary | ICD-10-CM | POA: Diagnosis not present

## 2018-01-29 DIAGNOSIS — E1136 Type 2 diabetes mellitus with diabetic cataract: Secondary | ICD-10-CM | POA: Diagnosis not present

## 2018-01-29 DIAGNOSIS — Z96643 Presence of artificial hip joint, bilateral: Secondary | ICD-10-CM | POA: Diagnosis not present

## 2018-01-29 DIAGNOSIS — Z7984 Long term (current) use of oral hypoglycemic drugs: Secondary | ICD-10-CM | POA: Insufficient documentation

## 2018-01-29 DIAGNOSIS — Z950 Presence of cardiac pacemaker: Secondary | ICD-10-CM | POA: Diagnosis not present

## 2018-01-29 DIAGNOSIS — N72 Inflammatory disease of cervix uteri: Secondary | ICD-10-CM | POA: Diagnosis not present

## 2018-01-29 DIAGNOSIS — Z90710 Acquired absence of both cervix and uterus: Secondary | ICD-10-CM | POA: Diagnosis present

## 2018-01-29 DIAGNOSIS — N812 Incomplete uterovaginal prolapse: Secondary | ICD-10-CM | POA: Insufficient documentation

## 2018-01-29 DIAGNOSIS — I441 Atrioventricular block, second degree: Secondary | ICD-10-CM | POA: Diagnosis not present

## 2018-01-29 DIAGNOSIS — E785 Hyperlipidemia, unspecified: Secondary | ICD-10-CM | POA: Insufficient documentation

## 2018-01-29 HISTORY — PX: ANTERIOR AND POSTERIOR REPAIR: SHX5121

## 2018-01-29 HISTORY — PX: CYSTOSCOPY: SHX5120

## 2018-01-29 HISTORY — PX: VAGINAL HYSTERECTOMY: SHX2639

## 2018-01-29 LAB — GLUCOSE, CAPILLARY
GLUCOSE-CAPILLARY: 154 mg/dL — AB (ref 70–99)
GLUCOSE-CAPILLARY: 169 mg/dL — AB (ref 70–99)
Glucose-Capillary: 122 mg/dL — ABNORMAL HIGH (ref 70–99)
Glucose-Capillary: 145 mg/dL — ABNORMAL HIGH (ref 70–99)

## 2018-01-29 SURGERY — HYSTERECTOMY, VAGINAL
Anesthesia: General | Site: Bladder

## 2018-01-29 MED ORDER — LIDOCAINE HCL 1 % IJ SOLN
INTRAMUSCULAR | Status: AC
  Filled 2018-01-29: qty 20

## 2018-01-29 MED ORDER — LACTATED RINGERS IV SOLN
INTRAVENOUS | Status: DC
  Administered 2018-01-29: 125 mL/h via INTRAVENOUS
  Administered 2018-01-29: 09:00:00 via INTRAVENOUS

## 2018-01-29 MED ORDER — KETOROLAC TROMETHAMINE 15 MG/ML IJ SOLN
15.0000 mg | Freq: Once | INTRAMUSCULAR | Status: DC
  Filled 2018-01-29: qty 1

## 2018-01-29 MED ORDER — ONDANSETRON HCL 4 MG/2ML IJ SOLN
INTRAMUSCULAR | Status: DC | PRN
  Administered 2018-01-29: 4 mg via INTRAVENOUS

## 2018-01-29 MED ORDER — IBUPROFEN 600 MG PO TABS
600.0000 mg | ORAL_TABLET | Freq: Four times a day (QID) | ORAL | Status: DC | PRN
  Administered 2018-01-29 – 2018-01-30 (×2): 600 mg via ORAL
  Filled 2018-01-29 (×2): qty 1

## 2018-01-29 MED ORDER — HYDROMORPHONE HCL 1 MG/ML IJ SOLN
INTRAMUSCULAR | Status: AC
  Administered 2018-01-29: 0.5 mg via INTRAVENOUS
  Filled 2018-01-29: qty 0.5

## 2018-01-29 MED ORDER — CEFAZOLIN SODIUM-DEXTROSE 2-4 GM/100ML-% IV SOLN
2.0000 g | INTRAVENOUS | Status: AC
  Administered 2018-01-29: 2 g via INTRAVENOUS

## 2018-01-29 MED ORDER — VASOPRESSIN 20 UNIT/ML IV SOLN
INTRAVENOUS | Status: DC | PRN
  Administered 2018-01-29: 17 mL via INTRAMUSCULAR

## 2018-01-29 MED ORDER — KETOROLAC TROMETHAMINE 30 MG/ML IJ SOLN
INTRAMUSCULAR | Status: DC | PRN
  Administered 2018-01-29: 15 mg via INTRAVENOUS

## 2018-01-29 MED ORDER — HYDROMORPHONE HCL 1 MG/ML IJ SOLN
0.2500 mg | INTRAMUSCULAR | Status: DC | PRN
  Administered 2018-01-29 (×4): 0.5 mg via INTRAVENOUS

## 2018-01-29 MED ORDER — FENTANYL CITRATE (PF) 250 MCG/5ML IJ SOLN
INTRAMUSCULAR | Status: AC
  Filled 2018-01-29: qty 5

## 2018-01-29 MED ORDER — ONDANSETRON HCL 4 MG/2ML IJ SOLN: 4.0000 mg | Freq: Four times a day (QID) | INTRAMUSCULAR | Status: DC | PRN

## 2018-01-29 MED ORDER — HYDROMORPHONE HCL 1 MG/ML IJ SOLN
INTRAMUSCULAR | Status: AC
  Filled 2018-01-29: qty 1

## 2018-01-29 MED ORDER — SUGAMMADEX SODIUM 200 MG/2ML IV SOLN
INTRAVENOUS | Status: DC | PRN
  Administered 2018-01-29: 126.6 mg via INTRAVENOUS

## 2018-01-29 MED ORDER — SUGAMMADEX SODIUM 200 MG/2ML IV SOLN
INTRAVENOUS | Status: AC
  Filled 2018-01-29: qty 2

## 2018-01-29 MED ORDER — METFORMIN HCL 500 MG PO TABS
1000.0000 mg | ORAL_TABLET | Freq: Every day | ORAL | Status: DC
  Administered 2018-01-29: 1000 mg via ORAL
  Filled 2018-01-29: qty 2

## 2018-01-29 MED ORDER — ROCURONIUM BROMIDE 100 MG/10ML IV SOLN
INTRAVENOUS | Status: DC | PRN
  Administered 2018-01-29: 30 mg via INTRAVENOUS
  Administered 2018-01-29: 10 mg via INTRAVENOUS

## 2018-01-29 MED ORDER — PROMETHAZINE HCL 25 MG/ML IJ SOLN: 6.2500 mg | INTRAMUSCULAR | Status: DC | PRN

## 2018-01-29 MED ORDER — SIMETHICONE 80 MG PO CHEW: 80.0000 mg | CHEWABLE_TABLET | Freq: Four times a day (QID) | ORAL | Status: DC | PRN

## 2018-01-29 MED ORDER — ONDANSETRON HCL 4 MG PO TABS: 4.0000 mg | ORAL_TABLET | Freq: Four times a day (QID) | ORAL | Status: DC | PRN

## 2018-01-29 MED ORDER — METFORMIN HCL 500 MG PO TABS: 500.0000 mg | ORAL_TABLET | ORAL | Status: DC

## 2018-01-29 MED ORDER — ONDANSETRON HCL 4 MG/2ML IJ SOLN
INTRAMUSCULAR | Status: AC
  Filled 2018-01-29: qty 2

## 2018-01-29 MED ORDER — METFORMIN HCL 500 MG PO TABS
500.0000 mg | ORAL_TABLET | ORAL | Status: DC
  Administered 2018-01-30: 500 mg via ORAL
  Filled 2018-01-29 (×3): qty 1

## 2018-01-29 MED ORDER — DEXAMETHASONE SODIUM PHOSPHATE 10 MG/ML IJ SOLN
INTRAMUSCULAR | Status: DC | PRN
  Administered 2018-01-29: 4 mg via INTRAVENOUS

## 2018-01-29 MED ORDER — FENTANYL CITRATE (PF) 100 MCG/2ML IJ SOLN
INTRAMUSCULAR | Status: DC | PRN
  Administered 2018-01-29: 150 ug via INTRAVENOUS
  Administered 2018-01-29 (×2): 50 ug via INTRAVENOUS

## 2018-01-29 MED ORDER — MAGNESIUM HYDROXIDE 400 MG/5ML PO SUSP: 30.0000 mL | Freq: Every day | ORAL | Status: DC | PRN

## 2018-01-29 MED ORDER — LACTATED RINGERS IV SOLN
INTRAVENOUS | Status: DC
  Administered 2018-01-29: 15:00:00 via INTRAVENOUS

## 2018-01-29 MED ORDER — LACTATED RINGERS IV SOLN: INTRAVENOUS | Status: DC

## 2018-01-29 MED ORDER — LIDOCAINE HCL (CARDIAC) PF 100 MG/5ML IV SOSY
PREFILLED_SYRINGE | INTRAVENOUS | Status: DC | PRN
  Administered 2018-01-29: 80 mg via INTRAVENOUS

## 2018-01-29 MED ORDER — MIDAZOLAM HCL 2 MG/2ML IJ SOLN
INTRAMUSCULAR | Status: AC
  Filled 2018-01-29: qty 2

## 2018-01-29 MED ORDER — INSULIN ASPART 100 UNIT/ML ~~LOC~~ SOLN
0.0000 [IU] | Freq: Three times a day (TID) | SUBCUTANEOUS | Status: DC
  Administered 2018-01-29: 2 [IU] via SUBCUTANEOUS

## 2018-01-29 MED ORDER — ATORVASTATIN CALCIUM 10 MG PO TABS
10.0000 mg | ORAL_TABLET | Freq: Every day | ORAL | Status: DC
  Administered 2018-01-29: 10 mg via ORAL
  Filled 2018-01-29: qty 1

## 2018-01-29 MED ORDER — MEPERIDINE HCL 25 MG/ML IJ SOLN: 6.2500 mg | INTRAMUSCULAR | Status: DC | PRN

## 2018-01-29 MED ORDER — STERILE WATER FOR IRRIGATION IR SOLN
Status: DC | PRN
  Administered 2018-01-29: 1000 mL via INTRAVESICAL

## 2018-01-29 MED ORDER — BUPIVACAINE HCL (PF) 0.5 % IJ SOLN
INTRAMUSCULAR | Status: AC
  Filled 2018-01-29: qty 30

## 2018-01-29 MED ORDER — MIDAZOLAM HCL 2 MG/2ML IJ SOLN
INTRAMUSCULAR | Status: DC | PRN
  Administered 2018-01-29: 0.5 mg via INTRAVENOUS

## 2018-01-29 MED ORDER — LIDOCAINE HCL (CARDIAC) PF 100 MG/5ML IV SOSY
PREFILLED_SYRINGE | INTRAVENOUS | Status: AC
  Filled 2018-01-29: qty 5

## 2018-01-29 MED ORDER — PROPOFOL 10 MG/ML IV BOLUS
INTRAVENOUS | Status: AC
  Filled 2018-01-29: qty 20

## 2018-01-29 MED ORDER — PROPOFOL 10 MG/ML IV BOLUS
INTRAVENOUS | Status: DC | PRN
  Administered 2018-01-29: 140 mg via INTRAVENOUS

## 2018-01-29 MED ORDER — KETOROLAC TROMETHAMINE 30 MG/ML IJ SOLN
INTRAMUSCULAR | Status: AC
  Filled 2018-01-29: qty 1

## 2018-01-29 MED ORDER — VASOPRESSIN 20 UNIT/ML IV SOLN
INTRAVENOUS | Status: AC
  Filled 2018-01-29: qty 1

## 2018-01-29 MED ORDER — ROCURONIUM BROMIDE 100 MG/10ML IV SOLN
INTRAVENOUS | Status: AC
  Filled 2018-01-29: qty 1

## 2018-01-29 MED ORDER — POLYETHYLENE GLYCOL 3350 17 GM/SCOOP PO POWD
17.0000 g | ORAL | Status: DC
  Filled 2018-01-29: qty 255

## 2018-01-29 MED ORDER — EPHEDRINE SULFATE 50 MG/ML IJ SOLN
INTRAMUSCULAR | Status: DC | PRN
  Administered 2018-01-29 (×3): 5 mg via INTRAVENOUS

## 2018-01-29 MED ORDER — ACETAMINOPHEN 325 MG PO TABS: 650.0000 mg | ORAL_TABLET | Freq: Four times a day (QID) | ORAL | Status: DC | PRN

## 2018-01-29 MED ORDER — LISINOPRIL 2.5 MG PO TABS
2.5000 mg | ORAL_TABLET | Freq: Two times a day (BID) | ORAL | Status: DC
  Filled 2018-01-29 (×2): qty 1

## 2018-01-29 MED ORDER — METHYLENE BLUE 0.5 % INJ SOLN
INTRAVENOUS | Status: AC
  Filled 2018-01-29: qty 10

## 2018-01-29 MED ORDER — EPHEDRINE 5 MG/ML INJ
INTRAVENOUS | Status: AC
  Filled 2018-01-29: qty 10

## 2018-01-29 MED ORDER — DEXAMETHASONE SODIUM PHOSPHATE 4 MG/ML IJ SOLN
INTRAMUSCULAR | Status: AC
  Filled 2018-01-29: qty 1

## 2018-01-29 MED ORDER — FLUORESCEIN SODIUM 10 % IV SOLN
INTRAVENOUS | Status: DC | PRN
  Administered 2018-01-29: 10 mg via INTRAVENOUS

## 2018-01-29 MED ORDER — SODIUM CHLORIDE 0.9 % IJ SOLN
INTRAMUSCULAR | Status: AC
  Filled 2018-01-29: qty 100

## 2018-01-29 MED ORDER — HYDROMORPHONE HCL 1 MG/ML IJ SOLN
0.2000 mg | INTRAMUSCULAR | Status: DC | PRN
  Administered 2018-01-29: 0.6 mg via INTRAVENOUS
  Filled 2018-01-29: qty 1

## 2018-01-29 MED ORDER — ESTRADIOL 0.1 MG/GM VA CREA
TOPICAL_CREAM | VAGINAL | Status: DC | PRN
  Administered 2018-01-29: 1 via VAGINAL

## 2018-01-29 MED ORDER — FLUORESCEIN SODIUM 10 % IV SOLN
INTRAVENOUS | Status: AC
  Filled 2018-01-29: qty 5

## 2018-01-29 MED ORDER — ESTRADIOL 0.1 MG/GM VA CREA
TOPICAL_CREAM | VAGINAL | Status: AC
  Filled 2018-01-29: qty 42.5

## 2018-01-29 MED ORDER — HYDROMORPHONE HCL 1 MG/ML IJ SOLN
INTRAMUSCULAR | Status: AC
  Filled 2018-01-29: qty 0.5

## 2018-01-29 SURGICAL SUPPLY — 32 items
BLADE SURG 15 STRL LF C SS BP (BLADE) ×3 IMPLANT
BLADE SURG 15 STRL SS (BLADE) ×2
CANISTER SUCT 3000ML PPV (MISCELLANEOUS) ×5 IMPLANT
CONT PATH 16OZ SNAP LID 3702 (MISCELLANEOUS) IMPLANT
DECANTER SPIKE VIAL GLASS SM (MISCELLANEOUS) ×5 IMPLANT
GAUZE PACKING 1 X5 YD ST (GAUZE/BANDAGES/DRESSINGS) ×5 IMPLANT
GAUZE PACKING 2X5 YD STRL (GAUZE/BANDAGES/DRESSINGS) ×5 IMPLANT
GLOVE BIO SURGEON STRL SZ 6.5 (GLOVE) ×4 IMPLANT
GLOVE BIO SURGEONS STRL SZ 6.5 (GLOVE) ×1
GLOVE BIOGEL PI IND STRL 6.5 (GLOVE) ×3 IMPLANT
GLOVE BIOGEL PI IND STRL 7.0 (GLOVE) ×3 IMPLANT
GLOVE BIOGEL PI INDICATOR 6.5 (GLOVE) ×2
GLOVE BIOGEL PI INDICATOR 7.0 (GLOVE) ×2
GLOVE ORTHO TXT STRL SZ7.5 (GLOVE) ×5 IMPLANT
GOWN STRL REUS W/TWL LRG LVL3 (GOWN DISPOSABLE) ×25 IMPLANT
NEEDLE HYPO 22GX1.5 SAFETY (NEEDLE) IMPLANT
NEEDLE MAYO CATGUT SZ4 (NEEDLE) IMPLANT
NEEDLE SPNL 22GX3.5 QUINCKE BK (NEEDLE) ×5 IMPLANT
NS IRRIG 1000ML POUR BTL (IV SOLUTION) ×5 IMPLANT
PACK VAGINAL WOMENS (CUSTOM PROCEDURE TRAY) ×5 IMPLANT
PAD OB MATERNITY 4.3X12.25 (PERSONAL CARE ITEMS) ×5 IMPLANT
SUT PROLENE 1 CT 1 30 (SUTURE) IMPLANT
SUT VIC AB 0 CT1 18XCR BRD8 (SUTURE) ×9 IMPLANT
SUT VIC AB 0 CT1 27 (SUTURE) ×2
SUT VIC AB 0 CT1 27XBRD ANBCTR (SUTURE) ×3 IMPLANT
SUT VIC AB 0 CT1 8-18 (SUTURE) ×6
SUT VIC AB 2-0 CT1 27 (SUTURE) ×4
SUT VIC AB 2-0 CT1 TAPERPNT 27 (SUTURE) ×6 IMPLANT
SUT VIC AB 2-0 UR5 27 (SUTURE) IMPLANT
SUT VICRYL 0 TIES 12 18 (SUTURE) ×5 IMPLANT
TOWEL OR 17X24 6PK STRL BLUE (TOWEL DISPOSABLE) ×10 IMPLANT
TRAY FOLEY W/BAG SLVR 14FR (SET/KITS/TRAYS/PACK) ×5 IMPLANT

## 2018-01-29 NOTE — Anesthesia Procedure Notes (Signed)
Procedure Name: Intubation Date/Time: 01/29/2018 7:28 AM Performed by: Hewitt Blade, CRNA Pre-anesthesia Checklist: Patient identified, Emergency Drugs available, Suction available and Patient being monitored Patient Re-evaluated:Patient Re-evaluated prior to induction Oxygen Delivery Method: Circle system utilized Preoxygenation: Pre-oxygenation with 100% oxygen Induction Type: IV induction Ventilation: Mask ventilation without difficulty Laryngoscope Size: Mac and 3 Grade View: Grade I Tube type: Oral Tube size: 7.0 mm Number of attempts: 1 Airway Equipment and Method: Stylet Placement Confirmation: ETT inserted through vocal cords under direct vision,  positive ETCO2 and breath sounds checked- equal and bilateral Secured at: 21 cm Tube secured with: Tape Dental Injury: Teeth and Oropharynx as per pre-operative assessment

## 2018-01-29 NOTE — Progress Notes (Signed)
Patient ID: Lauren Norton, female   DOB: [birth year], 72 y.o.   MRN: [id number] Per patient no changes in dictated H&P.  Brief exam WNL.  Had cardiology clearance of pacemaker yesterday.  No issues.

## 2018-01-29 NOTE — Discharge Summary (Signed)
Physician Discharge Summary  Patient ID: ANI DEOLIVEIRA MRN: [id number] DOB/AGE: Jul 26, 1945 72 y.o.  Admit date: 01/29/2018 Discharge date: 01/30/2018  Admission Diagnoses: Pelvic prolapse  Discharge Diagnoses:  Active Problems:   S/P vaginal hysterectomy with anterior /posterior repair and cystoscopy  Discharged Condition: good  Hospital Course: Pt in extended observation after surgery with good progress.  By POD #1 she was ambulating, voiding without problem, and tolerating po well.  Her pain was controlled on ibuprofen and tylenol. Her blood sugars were well-controlled on her metformin.   Consults: None  Significant Diagnostic Studies: labs: CBC  BMP  Treatments: surgery: TVH/ A&P repair  Discharge Exam: Blood pressure (!) 117/51, pulse 78, temperature 99.2 F (37.3 C), temperature source Oral, resp. rate 16, height 5' 5.75" (1.67 m), weight 63.3 kg, SpO2 97 %. General appearance: alert and cooperative GI: soft NT Pelvic: minimal vaginal spotting  Disposition: Discharge disposition: 01-Home or Self Care       Discharge Instructions    Call MD for:  persistant nausea and vomiting   Complete by:  As directed    Call MD for:  redness, tenderness, or signs of infection (pain, swelling, redness, odor or green/yellow discharge around incision site)   Complete by:  As directed    Call MD for:  severe uncontrolled pain   Complete by:  As directed    Call MD for:  temperature >100.4   Complete by:  As directed    Diet - low sodium heart healthy   Complete by:  As directed    Discharge instructions   Complete by:  As directed    Avoid driving for at least 1-2 weeks or until off pain meds.  No heavy lifting greater than 10 lbs.  Nothing in vagina for 6 weeks.     Allergies as of 01/30/2018      Reactions   Sulfa Antibiotics Itching      Medication List    STOP taking these medications   MOXATAG PO     TAKE these medications   acetaminophen 325 MG  tablet Commonly known as:  TYLENOL Take 2 tablets (650 mg total) by mouth every 6 (six) hours as needed for mild pain.   alendronate 70 MG tablet Commonly known as:  FOSAMAX Take 70 mg by mouth once a week. Tuesdays   atorvastatin 10 MG tablet Commonly known as:  LIPITOR Take 10 mg by mouth daily with supper.   Biotin 1000 MCG tablet Take 1,000 mcg by mouth 3 (three) times daily.   calcium citrate-vitamin D 315-200 MG-UNIT tablet Commonly known as:  CITRACAL+D Take 1 tablet by mouth 2 (two) times daily.   cetirizine 10 MG tablet Commonly known as:  ZYRTEC Take 5 mg by mouth at bedtime as needed for allergies.   CRANBERRY PO Take 650 mg by mouth 2 (two) times daily.   GLUCOSAMINE CHONDR 1500 COMPLX Caps Take 2 capsules by mouth daily.   ibuprofen 600 MG tablet Commonly known as:  ADVIL,MOTRIN Take 1 tablet (600 mg total) by mouth every 6 (six) hours as needed (mild pain).   lisinopril 2.5 MG tablet Commonly known as:  PRINIVIL,ZESTRIL Take 2.5 mg by mouth 2 (two) times daily.   metFORMIN 500 MG tablet Commonly known as:  GLUCOPHAGE Take 500-1,000 mg by mouth See admin instructions. Take 500mg  by mouth in the morning, 500mg  at lunch and 1000mg  at supper time   polyethylene glycol powder powder Commonly known as:  GLYCOLAX/MIRALAX Take 17 g by  mouth every other day.      Follow-up Information    Paula Compton, MD. Schedule an appointment as soon as possible for a visit in 6 week(s).   Specialty:  Obstetrics and Gynecology Why:  post-opertative check Contact information: [address] [place]  [id number] [phone]           Signed: Logan Bores 01/30/2018, 8:30 AM

## 2018-01-29 NOTE — Anesthesia Postprocedure Evaluation (Signed)
Anesthesia Post Note  Patient: Lauren Norton [facility]  Procedure(s) Performed: HYSTERECTOMY VAGINAL (N/A ) ANTERIOR (CYSTOCELE) AND POSTERIOR REPAIR (RECTOCELE) (N/A ) CYSTOSCOPY (N/A Bladder)     Patient location during evaluation: PACU Anesthesia Type: General Level of consciousness: sedated Pain management: pain level controlled Vital Signs Assessment: post-procedure vital signs reviewed and stable Respiratory status: spontaneous breathing Cardiovascular status: stable Postop Assessment: no apparent nausea or vomiting Anesthetic complications: no    Last Vitals:  Vitals:   01/29/18 1030 01/29/18 1045  BP: 130/65 129/63  Pulse: 65 72  Resp: 12 12  Temp:    SpO2: 99% 99%    Last Pain:  Vitals:   01/29/18 1015  TempSrc:   PainSc: 3    Pain Goal: Patients Stated Pain Goal: 3 (01/29/18 0277)               Lauren Norton JR,JOHN [facility]

## 2018-01-29 NOTE — Progress Notes (Signed)
Day of Surgery Procedure(s) (LRB): HYSTERECTOMY VAGINAL (N/A) ANTERIOR (CYSTOCELE) AND POSTERIOR REPAIR (RECTOCELE) (N/A) CYSTOSCOPY (N/A)  Subjective: Patient reports tolerating PO, no nausea, but not much appetite.  Ambulating well.  Pain minimal.   Foley and packing still in.    Objective: I have reviewed patient's vital signs and intake and output.  General: alert and cooperative GI: soft NT Vaginal Bleeding: minimal  Assessment: s/p Procedure(s): HYSTERECTOMY VAGINAL (N/A) ANTERIOR (CYSTOCELE) AND POSTERIOR REPAIR (RECTOCELE) (N/A) CYSTOSCOPY (N/A): progressing well  Plan: Will continue to increase ambulation, blood sugars covered with low dose SSI but back on metformin now. D/c packing and foley in AM Saline lock IV Plan d/c tomorrow  LOS: 0 days    Logan Bores 01/29/2018, 5:31 PM

## 2018-01-29 NOTE — Op Note (Signed)
Operative Note    Preoperative Diagnosis Symptomatic pelvic prolapse  Postoperative Diagnosis same  Procedure Total vaginal hysterectomy and anterior/posterior repair Cystoscopy  Surgeon Paula Compton, MD  Cheri Fowler, MD  Anesthesia GETA  Fluids: EBL 44mL UOP 162ml Clear IVF 1425mL  Findings Grade 2 uterine prolapse.  Grade 2 cystocele, grade 1 rectocele.  Uterus with small fibroids. Ovaries palpate normal but hard to visualize. No sutures visible in bladder on cystoscopy and ureters with efflux bilaterally  Specimen Uterus and cervix  Procedure Note Patient was taken to the operating room where general anesthesia was obtained without difficulty. She was then prepped and draped in the normal sterile fashion in the dorsal lithotomy position.  The bladder was drained with an in and out cath. An appropriate timeout was performed.  Attention was then turned to the vagina and the cervix was grasped with Yates Decamp tenaculums x 2 and injected with a dilute solution of Pitressin circumferentially.  The bovie was then used to make a circumferential incision.  The mayo scissors then further dissected the vaginal mucosa from the underlying cervix and the  posterior cul de sac entered sharply.  The anterior cul de sac was dissected away from the uterine fundus and the peritoneal reflection entered sharply when visible.  With a banana speculum and deaver retractor isolating the uterus from the bladder and rectum.  The uterosacral ligaments and paracervical tissue was taken down sequentially with zeppelin clamps and suture ligated with zero vicryl at each step.  When  the was uterus freed on the patient's left, it was then delivered and the remaining tissue on the right carefully taken down.  .The  utero-ovarian ligaments were taken down bilaterally and secured with a suture ligature.   Once the ligament was free, the pedicle was clamped and transected completely freeing the uterus.  It was  handed off to pathology. All pedicles were carefully inspected and seemed hemostatic.   The uterosacral ligaments were then approximated with zero vicryl.  The short weighted speculum was placed and the cuff run with a running locked 2-0 vicryl for hemostasis.  Attention was then turned to the anterior vaginal cuff which was grasped with alyss clamps and a midline incision begun after injection with dilute pitressin.  The vaginal mucosa was underscored and dilated off the pubo-vesicle fascia with the cystocele freed from the mucosa.  The fascia was then re-approximated with 0 vicryl interrupted sutures and the excess mucosa trimmed away.  The mucosa and the vaginal cuff were then closed with 2-0 vicryl in a running fashion.  Attention was finally turned posteriorly where the introitus was denuded and underscored in the midline with mayo scissors.  The vaginal mucosa was then dissected off the underlying puborectal fascia and reflected away from the midline.  The rectocele was then reduced and the puborectal fascia re-approximated with 0 vicryl interrupted sutures. The excess mucosa was then trimmed away and then closed with 2-0 vicryl in a running locked suture. A cystoscopy was performed and the bladder had no apparent injuries or sutures visible. The ureters were visualized bilaterally and efflux noted from both sides.  A foley catheter was placed at the conclusion.   All appeared hemostatic and estrace coated vaginal packing placed.  Sponge, instrument and needle counts were correct.    Patient was then awakened and taken to the recovery room in good condition.

## 2018-01-29 NOTE — Transfer of Care (Signed)
Immediate Anesthesia Transfer of Care Note  Patient: Akyah Lagrange [facility]  Procedure(s) Performed: HYSTERECTOMY VAGINAL (N/A ) ANTERIOR (CYSTOCELE) AND POSTERIOR REPAIR (RECTOCELE) (N/A ) CYSTOSCOPY (N/A Bladder)  Patient Location: PACU  Anesthesia Type:General  Level of Consciousness: awake, alert  and oriented  Airway & Oxygen Therapy: Patient Spontanous Breathing and Patient connected to nasal cannula oxygen  Post-op Assessment: Report given to RN, Post -op Vital signs reviewed and stable and Patient moving all extremities  Post vital signs: Reviewed and stable  Last Vitals:  Vitals Value Taken Time  BP 120/85 01/29/2018  9:45 AM  Temp    Pulse 81 01/29/2018  9:47 AM  Resp 14 01/29/2018  9:47 AM  SpO2 98 % 01/29/2018  9:47 AM  Vitals shown include unvalidated device data.  Last Pain:  Vitals:   01/29/18 0623  TempSrc: Oral  PainSc: 0-No pain      Patients Stated Pain Goal: 3 (35/32/99 2426)  Complications: No apparent anesthesia complications

## 2018-01-29 NOTE — Anesthesia Preprocedure Evaluation (Signed)
Anesthesia Evaluation  Patient identified by MRN, date of birth, ID band Patient awake    Reviewed: Allergy & Precautions, NPO status , Patient's Chart, lab work & pertinent test results  Airway Mallampati: I       Dental no notable dental hx. (+) Teeth Intact   Pulmonary    Pulmonary exam normal breath sounds clear to auscultation       Cardiovascular hypertension, Pt. on medications Normal cardiovascular exam+ pacemaker  Rhythm:Regular Rate:Normal     Neuro/Psych    GI/Hepatic   Endo/Other  diabetes, Type 2, Oral Hypoglycemic Agents  Renal/GU      Musculoskeletal   Abdominal Normal abdominal exam  (+)   Peds  Hematology negative hematology ROS (+)   Anesthesia Other Findings   Reproductive/Obstetrics                             Anesthesia Physical Anesthesia Plan  ASA: II  Anesthesia Plan: General   Post-op Pain Management:    Induction: Intravenous  PONV Risk Score and Plan: 4 or greater and Ondansetron, Treatment may vary due to age or medical condition and Dexamethasone  Airway Management Planned: LMA and Oral ETT  Additional Equipment:   Intra-op Plan:   Post-operative Plan: Extubation in OR  Informed Consent: I have reviewed the patients History and Physical, chart, labs and discussed the procedure including the risks, benefits and alternatives for the proposed anesthesia with the patient or authorized representative who has indicated his/her understanding and acceptance.   Dental advisory given  Plan Discussed with: CRNA and Surgeon  Anesthesia Plan Comments:         Anesthesia Quick Evaluation

## 2018-01-30 ENCOUNTER — Encounter (HOSPITAL_COMMUNITY): Payer: Self-pay | Admitting: Obstetrics and Gynecology

## 2018-01-30 DIAGNOSIS — N812 Incomplete uterovaginal prolapse: Secondary | ICD-10-CM | POA: Diagnosis not present

## 2018-01-30 LAB — CBC
HEMATOCRIT: 37 % (ref 36.0–46.0)
HEMOGLOBIN: 12 g/dL (ref 12.0–15.0)
MCH: 28.7 pg (ref 26.0–34.0)
MCHC: 32.4 g/dL (ref 30.0–36.0)
MCV: 88.5 fL (ref 78.0–100.0)
Platelets: 211 10*3/uL (ref 150–400)
RBC: 4.18 MIL/uL (ref 3.87–5.11)
RDW: 13.8 % (ref 11.5–15.5)
WBC: 9.8 10*3/uL (ref 4.0–10.5)

## 2018-01-30 LAB — BASIC METABOLIC PANEL
ANION GAP: 9 (ref 5–15)
BUN: 22 mg/dL (ref 8–23)
CHLORIDE: 99 mmol/L (ref 98–111)
CO2: 25 mmol/L (ref 22–32)
Calcium: 9.2 mg/dL (ref 8.9–10.3)
Creatinine, Ser: 0.9 mg/dL (ref 0.44–1.00)
GFR calc non Af Amer: >60 mL/min (ref >60)
Glucose, Bld: 131 mg/dL — ABNORMAL HIGH (ref 70–99)
POTASSIUM: 4.1 mmol/L (ref 3.5–5.1)
Sodium: 133 mmol/L — ABNORMAL LOW (ref 135–145)

## 2018-01-30 LAB — GLUCOSE, CAPILLARY: Glucose-Capillary: 126 mg/dL — ABNORMAL HIGH (ref 70–99)

## 2018-01-30 MED ORDER — IBUPROFEN 600 MG PO TABS: 600.0000 mg | ORAL_TABLET | Freq: Four times a day (QID) | ORAL | 0 refills | Status: DC | PRN

## 2018-01-30 MED ORDER — ACETAMINOPHEN 325 MG PO TABS: 650.0000 mg | ORAL_TABLET | Freq: Four times a day (QID) | ORAL | 0 refills | Status: DC | PRN

## 2018-01-30 NOTE — Progress Notes (Signed)
1 Day Post-Op Procedure(s) (LRB): HYSTERECTOMY VAGINAL (N/A) ANTERIOR (CYSTOCELE) AND POSTERIOR REPAIR (RECTOCELE) (N/A) CYSTOSCOPY (N/A)  Subjective: Patient reports tolerating PO and no problems voiding.  Packing out x 3 hours and just voided a normal volume.  No pain except tender at vagina where packing removed.  Tolerated breakfast  Objective: I have reviewed patient's vital signs, intake and output and labs.  General: alert and cooperative GI: soft NT Vaginal Bleeding: minimal  Assessment: s/p Procedure(s): HYSTERECTOMY VAGINAL (N/A) ANTERIOR (CYSTOCELE) AND POSTERIOR REPAIR (RECTOCELE) (N/A) CYSTOSCOPY (N/A): progressing well  Plan: Discharge home  LOS: 0 days    Logan Bores 01/30/2018, 8:26 AM

## 2018-02-20 ENCOUNTER — Encounter: Payer: Self-pay | Admitting: Internal Medicine

## 2018-02-21 ENCOUNTER — Ambulatory Visit (INDEPENDENT_AMBULATORY_CARE_PROVIDER_SITE_OTHER): Payer: Medicare Other | Admitting: *Deleted

## 2018-02-21 DIAGNOSIS — I442 Atrioventricular block, complete: Secondary | ICD-10-CM | POA: Diagnosis not present

## 2018-02-21 NOTE — Progress Notes (Signed)
Remote pacemaker transmission.   

## 2018-02-28 ENCOUNTER — Encounter: Payer: Medicare Other | Admitting: Internal Medicine

## 2018-03-13 LAB — CUP PACEART REMOTE DEVICE CHECK
Battery Voltage: 2.9 V
Brady Statistic AP VP Percent: 18 %
Brady Statistic RA Percent Paced: 16 %
Brady Statistic RV Percent Paced: 99 %
Implantable Lead Implant Date: 20130917
Implantable Lead Location: 753860
Implantable Lead Model: 1944
Implantable Lead Model: 1948
Implantable Pulse Generator Implant Date: 20130917
Lead Channel Impedance Value: 530 Ohm
Lead Channel Impedance Value: 600 Ohm
Lead Channel Pacing Threshold Amplitude: 0.5 V
Lead Channel Pacing Threshold Pulse Width: 0.5 ms
Lead Channel Pacing Threshold Pulse Width: 0.5 ms
Lead Channel Setting Pacing Amplitude: 0.75 V
MDC IDC LEAD IMPLANT DT: 20130917
MDC IDC LEAD LOCATION: 753859
MDC IDC MSMT BATTERY REMAINING LONGEVITY: 83 mo
MDC IDC MSMT BATTERY REMAINING PERCENTAGE: 65 %
MDC IDC MSMT LEADCHNL RA SENSING INTR AMPL: 2.3 mV
MDC IDC MSMT LEADCHNL RV PACING THRESHOLD AMPLITUDE: 0.5 V
MDC IDC MSMT LEADCHNL RV SENSING INTR AMPL: 12 mV
MDC IDC SESS DTM: 20190912071339
MDC IDC SET LEADCHNL RA PACING AMPLITUDE: 2 V
MDC IDC SET LEADCHNL RV PACING PULSEWIDTH: 0.5 ms
MDC IDC SET LEADCHNL RV SENSING SENSITIVITY: 2 mV
MDC IDC STAT BRADY AP VS PERCENT: 1 %
MDC IDC STAT BRADY AS VP PERCENT: 81 %
MDC IDC STAT BRADY AS VS PERCENT: 1 %
Pulse Gen Model: 2210
Pulse Gen Serial Number: 7387861

## 2018-05-23 ENCOUNTER — Ambulatory Visit (INDEPENDENT_AMBULATORY_CARE_PROVIDER_SITE_OTHER): Payer: Medicare Other

## 2018-05-23 DIAGNOSIS — I442 Atrioventricular block, complete: Secondary | ICD-10-CM

## 2018-05-24 ENCOUNTER — Encounter: Payer: Self-pay | Admitting: Cardiology

## 2018-05-24 NOTE — Progress Notes (Signed)
Remote pacemaker transmission.   

## 2018-07-07 LAB — CUP PACEART REMOTE DEVICE CHECK
Battery Remaining Longevity: 74 mo
Battery Remaining Percentage: 57 %
Battery Voltage: 2.89 V
Brady Statistic AP VP Percent: 10 %
Brady Statistic AP VS Percent: 1 %
Brady Statistic AS VS Percent: 1 %
Brady Statistic RA Percent Paced: 9.5 %
Brady Statistic RV Percent Paced: 99 %
Date Time Interrogation Session: 20191212074524
Implantable Lead Implant Date: 20130917
Implantable Lead Location: 753860
Implantable Lead Model: 1948
Implantable Pulse Generator Implant Date: 20130917
Lead Channel Impedance Value: 530 Ohm
Lead Channel Impedance Value: 610 Ohm
Lead Channel Pacing Threshold Amplitude: 0.5 V
Lead Channel Pacing Threshold Pulse Width: 0.5 ms
Lead Channel Sensing Intrinsic Amplitude: 1.3 mV
Lead Channel Setting Sensing Sensitivity: 2 mV
MDC IDC LEAD IMPLANT DT: 20130917
MDC IDC LEAD LOCATION: 753859
MDC IDC MSMT LEADCHNL RV PACING THRESHOLD AMPLITUDE: 0.625 V
MDC IDC MSMT LEADCHNL RV PACING THRESHOLD PULSEWIDTH: 0.5 ms
MDC IDC MSMT LEADCHNL RV SENSING INTR AMPL: 10.1 mV
MDC IDC PG SERIAL: 7387861
MDC IDC SET LEADCHNL RA PACING AMPLITUDE: 2 V
MDC IDC SET LEADCHNL RV PACING AMPLITUDE: 0.875
MDC IDC SET LEADCHNL RV PACING PULSEWIDTH: 0.5 ms
MDC IDC STAT BRADY AS VP PERCENT: 90 %

## 2018-08-22 ENCOUNTER — Ambulatory Visit (INDEPENDENT_AMBULATORY_CARE_PROVIDER_SITE_OTHER): Payer: Medicare Other | Admitting: *Deleted

## 2018-08-22 DIAGNOSIS — I442 Atrioventricular block, complete: Secondary | ICD-10-CM | POA: Diagnosis not present

## 2018-08-23 LAB — CUP PACEART REMOTE DEVICE CHECK
Battery Remaining Longevity: 73 mo
Battery Voltage: 2.89 V
Brady Statistic RA Percent Paced: 8.3 %
Brady Statistic RV Percent Paced: 99 %
Date Time Interrogation Session: 20200312061713
Implantable Lead Implant Date: 20130917
Implantable Lead Location: 753859
Implantable Lead Location: 753860
Implantable Lead Model: 1944
Implantable Lead Model: 1948
Lead Channel Impedance Value: 600 Ohm
Lead Channel Pacing Threshold Amplitude: 0.5 V
Lead Channel Pacing Threshold Pulse Width: 0.5 ms
Lead Channel Sensing Intrinsic Amplitude: 10.1 mV
Lead Channel Setting Pacing Amplitude: 2 V
MDC IDC LEAD IMPLANT DT: 20130917
MDC IDC MSMT BATTERY REMAINING PERCENTAGE: 57 %
MDC IDC MSMT LEADCHNL RA IMPEDANCE VALUE: 510 Ohm
MDC IDC MSMT LEADCHNL RA SENSING INTR AMPL: 1.5 mV
MDC IDC MSMT LEADCHNL RV PACING THRESHOLD AMPLITUDE: 0.625 V
MDC IDC MSMT LEADCHNL RV PACING THRESHOLD PULSEWIDTH: 0.5 ms
MDC IDC PG IMPLANT DT: 20130917
MDC IDC PG SERIAL: 7387861
MDC IDC SET LEADCHNL RV PACING AMPLITUDE: 0.875
MDC IDC SET LEADCHNL RV PACING PULSEWIDTH: 0.5 ms
MDC IDC SET LEADCHNL RV SENSING SENSITIVITY: 2 mV
MDC IDC STAT BRADY AP VP PERCENT: 8.6 %
MDC IDC STAT BRADY AP VS PERCENT: 1 %
MDC IDC STAT BRADY AS VP PERCENT: 91 %
MDC IDC STAT BRADY AS VS PERCENT: 1 %
Pulse Gen Model: 2210

## 2018-08-29 ENCOUNTER — Encounter: Payer: Self-pay | Admitting: Cardiology

## 2018-08-29 NOTE — Progress Notes (Signed)
Remote pacemaker transmission.   

## 2018-11-21 ENCOUNTER — Ambulatory Visit (INDEPENDENT_AMBULATORY_CARE_PROVIDER_SITE_OTHER): Payer: Medicare Other | Admitting: *Deleted

## 2018-11-21 DIAGNOSIS — I442 Atrioventricular block, complete: Secondary | ICD-10-CM

## 2018-11-21 LAB — CUP PACEART REMOTE DEVICE CHECK
Battery Remaining Longevity: 65 mo
Battery Remaining Percentage: 51 %
Battery Voltage: 2.87 V
Brady Statistic AP VP Percent: 8.6 %
Brady Statistic AP VS Percent: 1 %
Brady Statistic AS VP Percent: 91 %
Brady Statistic AS VS Percent: 1 %
Brady Statistic RA Percent Paced: 8.4 %
Brady Statistic RV Percent Paced: 99 %
Date Time Interrogation Session: 20200611064808
Implantable Lead Implant Date: 20130917
Implantable Lead Implant Date: 20130917
Implantable Lead Location: 753859
Implantable Lead Location: 753860
Implantable Lead Model: 1944
Implantable Lead Model: 1948
Implantable Pulse Generator Implant Date: 20130917
Lead Channel Impedance Value: 550 Ohm
Lead Channel Impedance Value: 580 Ohm
Lead Channel Pacing Threshold Amplitude: 0.625 V
Lead Channel Pacing Threshold Pulse Width: 0.5 ms
Lead Channel Sensing Intrinsic Amplitude: 2.8 mV
Lead Channel Setting Pacing Amplitude: 0.875
Lead Channel Setting Pacing Amplitude: 2 V
Lead Channel Setting Pacing Pulse Width: 0.5 ms
Lead Channel Setting Sensing Sensitivity: 2 mV
Pulse Gen Model: 2210
Pulse Gen Serial Number: 7387861

## 2018-11-28 ENCOUNTER — Encounter: Payer: Self-pay | Admitting: Cardiology

## 2018-11-28 NOTE — Progress Notes (Signed)
Remote pacemaker transmission.   

## 2019-02-07 ENCOUNTER — Telehealth: Payer: Self-pay

## 2019-02-07 NOTE — Telephone Encounter (Signed)
Spoke with pt regarding her appt on 02/10/19. Pt stated she will set up her MyChart account. Pt was advise to check her vitals prior to her appt and call me if she has any concerns.

## 2019-02-10 ENCOUNTER — Encounter: Payer: Self-pay | Admitting: Internal Medicine

## 2019-02-10 ENCOUNTER — Telehealth (INDEPENDENT_AMBULATORY_CARE_PROVIDER_SITE_OTHER): Payer: Medicare Other | Admitting: Internal Medicine

## 2019-02-10 VITALS — BP 122/77 | HR 85 | Ht 68.0 in | Wt 142.8 lb

## 2019-02-10 DIAGNOSIS — I10 Essential (primary) hypertension: Secondary | ICD-10-CM | POA: Diagnosis not present

## 2019-02-10 DIAGNOSIS — I442 Atrioventricular block, complete: Secondary | ICD-10-CM

## 2019-02-10 NOTE — Progress Notes (Signed)
Electrophysiology TeleHealth Note   Due to national recommendations of social distancing due to COVID 19, an audio/video telehealth visit is felt to be most appropriate for this patient at this time.  See MyChart message from today for the patient's consent to telehealth for [facility].   Date:  02/10/2019   ID:  Lauren Norton, DOB 02-05-1946, MRN [id number]  Location: patient's home  Provider location:  [place]  Evaluation Performed: Follow-up visit  PCP:  Prince Solian, MD   Electrophysiologist:  Dr Rayann Heman  Chief Complaint:  palpitations  History of Present Illness:    Lauren Norton is a 73 y.o. female who presents via telehealth conferencing today.  Since last being seen in our clinic, the patient reports doing very well.  Today, she denies symptoms of palpitations, chest pain, shortness of breath,  lower extremity edema, dizziness, presyncope, or syncope.  She walks her dog daily, without concerns.  The patient is otherwise without complaint today.  The patient denies symptoms of fevers, chills, cough, or new SOB worrisome for COVID 19.  Past Medical History:  Diagnosis Date  . Anxiety   . Bronchitis 2018  . Cataract 2017   both eyes  . Diabetes ([place])   . HLD (hyperlipidemia)   . HTN (hypertension)   . Osteoarthritis of both hips    both hips replaced  . Pneumonia    many years ago  . Presence of permanent cardiac pacemaker    2013  . Second degree AV block    a.s/p MDT dual chamber PPM   . Shingles   . Skin cancer ~ 2007   "back"    Past Surgical History:  Procedure Laterality Date  . ANTERIOR AND POSTERIOR REPAIR N/A 01/29/2018   Procedure: ANTERIOR (CYSTOCELE) AND POSTERIOR REPAIR (RECTOCELE);  Surgeon: Paula Compton, MD;  Location: [facility];  Service: Gynecology;  Laterality: N/A;  . CATARACT EXTRACTION, BILATERAL  2017  . CYST REMOVAL NECK  2016  . CYSTOSCOPY N/A 01/29/2018   Procedure: CYSTOSCOPY;  Surgeon: Paula Compton,  MD;  Location: [facility];  Service: Gynecology;  Laterality: N/A;  . JOINT REPLACEMENT Bilateral    bilateral hip replacements  . left elbow fracture     age 17  . PERMANENT PACEMAKER INSERTION N/A 02/27/2012   a. MDT dual chamber PPM implanted by Dr Rayann Heman for Mobitz II  . SKIN CANCER EXCISION  ~ 2007  . TONSILLECTOMY AND ADENOIDECTOMY  ~ 1960  . TOTAL HIP ARTHROPLASTY Bilateral 2001; 2012   right; left  . VAGINAL HYSTERECTOMY N/A 01/29/2018   Procedure: HYSTERECTOMY VAGINAL;  Surgeon: Paula Compton, MD;  Location: [facility];  Service: Gynecology;  Laterality: N/A;    Current Outpatient Medications  Medication Sig Dispense Refill  . acetaminophen (TYLENOL) 325 MG tablet Take 2 tablets (650 mg total) by mouth every 6 (six) hours as needed for mild pain. 30 tablet 0  . alendronate (FOSAMAX) 70 MG tablet Take 70 mg by mouth once a week. Tuesdays    . atorvastatin (LIPITOR) 10 MG tablet Take 10 mg by mouth daily with supper.     . Biotin 5000 MCG TABS Take 1 tablet by mouth daily.    . calcium citrate-vitamin D (CITRACAL+D) 315-200 MG-UNIT tablet Take 1 tablet by mouth 2 (two) times daily.    . cetirizine (ZYRTEC) 10 MG tablet Take 5 mg by mouth at bedtime as needed for allergies.     Marland Kitchen CINNAMON PO Take 500 mg by mouth  daily.    Marland Kitchen CRANBERRY PO Take 650 mg by mouth 2 (two) times daily.     . Glucosamine-Chondroit-Vit C-Mn (GLUCOSAMINE CHONDR 1500 COMPLX) CAPS Take 2 capsules by mouth daily.    Marland Kitchen ibuprofen (ADVIL,MOTRIN) 600 MG tablet Take 1 tablet (600 mg total) by mouth every 6 (six) hours as needed (mild pain). 30 tablet 0  . lisinopril (ZESTRIL) 2.5 MG tablet Take 2.5 mg by mouth daily.    . metFORMIN (GLUCOPHAGE) 500 MG tablet Take 500-1,000 mg by mouth See admin instructions. Take 500mg  by mouth in the morning, 500mg  at lunch and 1000mg  at supper time    . polyethylene glycol powder (GLYCOLAX/MIRALAX) powder Take 17 g by mouth every other day.     . sodium chloride (MURO 128) 2 %  ophthalmic solution Place 1 drop into both eyes as needed for eye irritation.    . vitamin B-12 (CYANOCOBALAMIN) 1000 MCG tablet Take 1,000 mcg by mouth daily.     No current facility-administered medications for this visit.     Allergies:   Sulfa antibiotics   Social History:  The patient  reports that she has never smoked. She has never used smokeless tobacco. She reports current alcohol use of about 6.0 standard drinks of alcohol per week. She reports that she does not use drugs.   Family History:  The patient's  family history includes Alzheimer's disease in her mother; CAD in her father; Diabetes in her father; Heart attack in her father; Heart disease in her maternal grandmother; Hypertension in her father; Prostate cancer in her father.   ROS:  Please see the history of present illness.   All other systems are personally reviewed and negative.    Exam:    Vital Signs:  BP 122/77   Pulse 85   Ht 5\' 8"  (1.727 m)   Wt 142 lb 12.8 oz (64.8 kg)   BMI 21.71 kg/m   Well sounding and appearing, alert and conversant, regular work of breathing,  good skin color Eyes- anicteric, wears glasses today,  neuro- grossly intact, skin- no apparent rash or lesions or cyanosis, mouth- oral mucosa is pink  Labs/Other Tests and Data Reviewed:    Recent Labs: No results found for requested labs within last 8760 hours.   Wt Readings from Last 3 Encounters:  02/10/19 142 lb 12.8 oz (64.8 kg)  01/29/18 139 lb 8.8 oz (63.3 kg)  01/28/18 139 lb 9.6 oz (63.3 kg)     Last device remote is reviewed from [place] PDF which reveals normal device function, no arrhythmias    ASSESSMENT & PLAN:    1.  Complete heart block Remotes are uptodate Normal device function by remotes  2. HTN Stable No change required today   Follow-up:  Return to see EP NP in a year   Patient Risk:  after full review of this patients clinical status, I feel that they are at moderate risk at this time.  Today, I  have spent 15 minutes with the patient with telehealth technology discussing arrhythmia management .    Army Fossa, MD  02/10/2019 2:32 PM     [place] [address] [phone] (office) [phone] (fax)

## 2019-02-20 ENCOUNTER — Ambulatory Visit (INDEPENDENT_AMBULATORY_CARE_PROVIDER_SITE_OTHER): Payer: Medicare Other | Admitting: *Deleted

## 2019-02-20 DIAGNOSIS — I442 Atrioventricular block, complete: Secondary | ICD-10-CM

## 2019-02-22 LAB — CUP PACEART REMOTE DEVICE CHECK
Battery Remaining Longevity: 68 mo
Battery Remaining Percentage: 51 %
Battery Voltage: 2.87 V
Brady Statistic AP VP Percent: 8.4 %
Brady Statistic AP VS Percent: 1 %
Brady Statistic AS VP Percent: 91 %
Brady Statistic AS VS Percent: 1 %
Brady Statistic RA Percent Paced: 8.2 %
Brady Statistic RV Percent Paced: 99 %
Date Time Interrogation Session: 20200910071718
Implantable Lead Implant Date: 20130917
Implantable Lead Implant Date: 20130917
Implantable Lead Location: 753859
Implantable Lead Location: 753860
Implantable Lead Model: 1944
Implantable Lead Model: 1948
Implantable Pulse Generator Implant Date: 20130917
Lead Channel Impedance Value: 560 Ohm
Lead Channel Impedance Value: 650 Ohm
Lead Channel Pacing Threshold Amplitude: 0.5 V
Lead Channel Pacing Threshold Amplitude: 0.625 V
Lead Channel Pacing Threshold Pulse Width: 0.5 ms
Lead Channel Pacing Threshold Pulse Width: 0.5 ms
Lead Channel Sensing Intrinsic Amplitude: 10.1 mV
Lead Channel Sensing Intrinsic Amplitude: 2 mV
Lead Channel Setting Pacing Amplitude: 0.875
Lead Channel Setting Pacing Amplitude: 2 V
Lead Channel Setting Pacing Pulse Width: 0.5 ms
Lead Channel Setting Sensing Sensitivity: 2 mV
Pulse Gen Model: 2210
Pulse Gen Serial Number: 7387861

## 2019-03-03 NOTE — Progress Notes (Signed)
Remote pacemaker transmission.   

## 2019-05-22 ENCOUNTER — Ambulatory Visit (INDEPENDENT_AMBULATORY_CARE_PROVIDER_SITE_OTHER): Payer: Medicare Other | Admitting: *Deleted

## 2019-05-22 DIAGNOSIS — I442 Atrioventricular block, complete: Secondary | ICD-10-CM | POA: Diagnosis not present

## 2019-05-22 LAB — CUP PACEART REMOTE DEVICE CHECK
Battery Remaining Longevity: 58 mo
Battery Remaining Percentage: 46 %
Battery Voltage: 2.86 V
Brady Statistic AP VP Percent: 8.6 %
Brady Statistic AP VS Percent: 1 %
Brady Statistic AS VP Percent: 91 %
Brady Statistic AS VS Percent: 1 %
Brady Statistic RA Percent Paced: 8.4 %
Brady Statistic RV Percent Paced: 99 %
Date Time Interrogation Session: 20201210024909
Implantable Lead Implant Date: 20130917
Implantable Lead Implant Date: 20130917
Implantable Lead Location: 753859
Implantable Lead Location: 753860
Implantable Lead Model: 1944
Implantable Lead Model: 1948
Implantable Pulse Generator Implant Date: 20130917
Lead Channel Impedance Value: 490 Ohm
Lead Channel Impedance Value: 590 Ohm
Lead Channel Pacing Threshold Amplitude: 0.5 V
Lead Channel Pacing Threshold Amplitude: 0.625 V
Lead Channel Pacing Threshold Pulse Width: 0.5 ms
Lead Channel Pacing Threshold Pulse Width: 0.5 ms
Lead Channel Sensing Intrinsic Amplitude: 1.2 mV
Lead Channel Sensing Intrinsic Amplitude: 10.1 mV
Lead Channel Setting Pacing Amplitude: 0.875
Lead Channel Setting Pacing Amplitude: 2 V
Lead Channel Setting Pacing Pulse Width: 0.5 ms
Lead Channel Setting Sensing Sensitivity: 2 mV
Pulse Gen Model: 2210
Pulse Gen Serial Number: 7387861

## 2019-06-28 NOTE — Progress Notes (Signed)
PPM remote 

## 2019-07-01 ENCOUNTER — Ambulatory Visit: Payer: Medicare Other | Attending: Internal Medicine

## 2019-07-01 DIAGNOSIS — Z23 Encounter for immunization: Secondary | ICD-10-CM | POA: Insufficient documentation

## 2019-07-01 NOTE — Progress Notes (Signed)
   Covid-19 Vaccination Clinic  Name:  Lauren Norton    MRN: [id number] DOB: [birth year]  07/01/2019  [name] was observed post Covid-19 immunization for 15 minutes without incidence. She was provided with Vaccine Information Sheet and instruction to access the V-Safe system.   [name] was instructed to call 911 with any severe reactions post vaccine: Marland Kitchen Difficulty breathing  . Swelling of your face and throat  . A fast heartbeat  . A bad rash all over your body  . Dizziness and weakness    Immunizations Administered    Name Date Dose VIS Date Route   Pfizer COVID-19 Vaccine 07/01/2019  2:38 PM 0.3 mL 05/23/2019 Intramuscular   Manufacturer: [place]   Lot: [id number]   [place]: [id number]

## 2019-07-21 ENCOUNTER — Ambulatory Visit: Payer: Medicare Other | Attending: Internal Medicine

## 2019-07-21 DIAGNOSIS — Z23 Encounter for immunization: Secondary | ICD-10-CM

## 2019-07-21 NOTE — Progress Notes (Signed)
   Covid-19 Vaccination Clinic  Name:  Lauren Norton    MRN: [id number] DOB: March 02, 1946  07/21/2019  Ms. Lauren Norton was observed post Covid-19 immunization for 15 minutes without incidence. She was provided with Vaccine Information Sheet and instruction to access the V-Safe system.   Ms. Lauren Norton was instructed to call 911 with any severe reactions post vaccine: Marland Kitchen Difficulty breathing  . Swelling of your face and throat  . A fast heartbeat  . A bad rash all over your body  . Dizziness and weakness    Immunizations Administered    Name Date Dose VIS Date Route   Pfizer COVID-19 Vaccine 07/21/2019  2:31 PM 0.3 mL 05/23/2019 Intramuscular   Manufacturer: [place]   Lot: [id number]   [place]: [id number]

## 2019-08-21 ENCOUNTER — Ambulatory Visit (INDEPENDENT_AMBULATORY_CARE_PROVIDER_SITE_OTHER): Payer: Medicare Other | Admitting: *Deleted

## 2019-08-21 DIAGNOSIS — I442 Atrioventricular block, complete: Secondary | ICD-10-CM | POA: Diagnosis not present

## 2019-08-21 LAB — CUP PACEART REMOTE DEVICE CHECK
Battery Remaining Longevity: 59 mo
Battery Remaining Percentage: 46 %
Battery Voltage: 2.86 V
Brady Statistic AP VP Percent: 8.7 %
Brady Statistic AP VS Percent: 1 %
Brady Statistic AS VP Percent: 91 %
Brady Statistic AS VS Percent: 1 %
Brady Statistic RA Percent Paced: 8.5 %
Brady Statistic RV Percent Paced: 99 %
Date Time Interrogation Session: 20210311032153
Implantable Lead Implant Date: 20130917
Implantable Lead Implant Date: 20130917
Implantable Lead Location: 753859
Implantable Lead Location: 753860
Implantable Lead Model: 1944
Implantable Lead Model: 1948
Implantable Pulse Generator Implant Date: 20130917
Lead Channel Impedance Value: 630 Ohm
Lead Channel Impedance Value: 640 Ohm
Lead Channel Pacing Threshold Amplitude: 0.5 V
Lead Channel Pacing Threshold Amplitude: 0.625 V
Lead Channel Pacing Threshold Pulse Width: 0.5 ms
Lead Channel Pacing Threshold Pulse Width: 0.5 ms
Lead Channel Sensing Intrinsic Amplitude: 12 mV
Lead Channel Sensing Intrinsic Amplitude: 2.9 mV
Lead Channel Setting Pacing Amplitude: 0.875
Lead Channel Setting Pacing Amplitude: 2 V
Lead Channel Setting Pacing Pulse Width: 0.5 ms
Lead Channel Setting Sensing Sensitivity: 2 mV
Pulse Gen Model: 2210
Pulse Gen Serial Number: 7387861

## 2019-08-22 NOTE — Progress Notes (Signed)
PPM Remote  

## 2019-11-20 ENCOUNTER — Ambulatory Visit (INDEPENDENT_AMBULATORY_CARE_PROVIDER_SITE_OTHER): Payer: Medicare Other | Admitting: *Deleted

## 2019-11-20 DIAGNOSIS — I442 Atrioventricular block, complete: Secondary | ICD-10-CM

## 2019-11-20 LAB — CUP PACEART REMOTE DEVICE CHECK
Battery Remaining Longevity: 52 mo
Battery Remaining Percentage: 40 %
Battery Voltage: 2.84 V
Brady Statistic AP VP Percent: 8.6 %
Brady Statistic AP VS Percent: 1 %
Brady Statistic AS VP Percent: 91 %
Brady Statistic AS VS Percent: 1 %
Brady Statistic RA Percent Paced: 8.4 %
Brady Statistic RV Percent Paced: 99 %
Date Time Interrogation Session: 20210610025342
Implantable Lead Implant Date: 20130917
Implantable Lead Implant Date: 20130917
Implantable Lead Location: 753859
Implantable Lead Location: 753860
Implantable Lead Model: 1944
Implantable Lead Model: 1948
Implantable Pulse Generator Implant Date: 20130917
Lead Channel Impedance Value: 640 Ohm
Lead Channel Impedance Value: 680 Ohm
Lead Channel Pacing Threshold Amplitude: 0.5 V
Lead Channel Pacing Threshold Amplitude: 0.625 V
Lead Channel Pacing Threshold Pulse Width: 0.5 ms
Lead Channel Pacing Threshold Pulse Width: 0.5 ms
Lead Channel Sensing Intrinsic Amplitude: 12 mV
Lead Channel Sensing Intrinsic Amplitude: 3.8 mV
Lead Channel Setting Pacing Amplitude: 0.875
Lead Channel Setting Pacing Amplitude: 2 V
Lead Channel Setting Pacing Pulse Width: 0.5 ms
Lead Channel Setting Sensing Sensitivity: 2 mV
Pulse Gen Model: 2210
Pulse Gen Serial Number: 7387861

## 2019-11-21 NOTE — Progress Notes (Signed)
Remote pacemaker transmission.   

## 2020-02-16 NOTE — Progress Notes (Signed)
Cardiology Office Note Date:  02/19/2020  Patient ID:  YAN OKRAY, DOB [birth year], MRN [id number] PCP:  Prince Solian, MD  Cardiologist/Electrophysiologist: Dr. Rayann Heman    Chief Complaint:  annual EP visit  History of Present Illness: Lauren Norton is a 74 y.o. female with history of HTN, HLD, DM, advanced heart block w/PPM  She comes in today to be seen for dr. Rayann Heman, last seen by him via tele health visit Aug 2020.  She felt well, walking her dog daily.  Noting normal remotes, no changes, planned for an annual APP visit.  Upon entering the room she tells me her BP and HR are always high at the doctor and she gets very, very nervous and anxious.  She has been staying home and being out and about is also very anxiety provoking She is feeling well.  Reports walking nearly every day with excellent exertional capacity, no CP or palpitations, no cardiac awareness.  No dizzy spells near syncope or syncope. About a year ago she started noticing with some weight loss and her walking her BP started to lower, and noted on occasion as low as SBPs 89-90's and reduced her lisinopril to 2.5mg  from 5 and her BP at home are very good, and rarely >120   She has labs done with her PMD  Device information SJM dual chamber PPM implanted 02/27/2012   Past Medical History:  Diagnosis Date  . Anxiety   . Bronchitis 2018  . Cataract 2017   both eyes  . Diabetes ([place])   . HLD (hyperlipidemia)   . HTN (hypertension)   . Osteoarthritis of both hips    both hips replaced  . Pneumonia    many years ago  . Presence of permanent cardiac pacemaker    2013  . Second degree AV block    a.s/p MDT dual chamber PPM   . Shingles   . Skin cancer ~ 2007   "back"    Past Surgical History:  Procedure Laterality Date  . ANTERIOR AND POSTERIOR REPAIR N/A 01/29/2018   Procedure: ANTERIOR (CYSTOCELE) AND POSTERIOR REPAIR (RECTOCELE);  Surgeon: Paula Compton, MD;  Location: [facility];  Service:  Gynecology;  Laterality: N/A;  . CATARACT EXTRACTION, BILATERAL  2017  . CYST REMOVAL NECK  2016  . CYSTOSCOPY N/A 01/29/2018   Procedure: CYSTOSCOPY;  Surgeon: Paula Compton, MD;  Location: [facility];  Service: Gynecology;  Laterality: N/A;  . JOINT REPLACEMENT Bilateral    bilateral hip replacements  . left elbow fracture     age 56  . PERMANENT PACEMAKER INSERTION N/A 02/27/2012   a. MDT dual chamber PPM implanted by Dr Rayann Heman for Mobitz II  . SKIN CANCER EXCISION  ~ 2007  . TONSILLECTOMY AND ADENOIDECTOMY  ~ 1960  . TOTAL HIP ARTHROPLASTY Bilateral 2001; 2012   right; left  . VAGINAL HYSTERECTOMY N/A 01/29/2018   Procedure: HYSTERECTOMY VAGINAL;  Surgeon: Paula Compton, MD;  Location: [facility];  Service: Gynecology;  Laterality: N/A;    Current Outpatient Medications  Medication Sig Dispense Refill  . acetaminophen (TYLENOL) 325 MG tablet Take 2 tablets (650 mg total) by mouth every 6 (six) hours as needed for mild pain. 30 tablet 0  . alendronate (FOSAMAX) 70 MG tablet Take 70 mg by mouth once a week. Tuesdays    . atorvastatin (LIPITOR) 10 MG tablet Take 10 mg by mouth daily with supper.     . Biotin 5000 MCG TABS Take 1 tablet by mouth daily.    Marland Kitchen  calcium citrate-vitamin D (CITRACAL+D) 315-200 MG-UNIT tablet Take 1 tablet by mouth 2 (two) times daily.    . cetirizine (ZYRTEC) 10 MG tablet Take 5 mg by mouth at bedtime as needed for allergies.     Marland Kitchen CINNAMON PO Take 650 mg by mouth daily.     . Coenzyme Q10 (CO Q 10) 100 MG CAPS Take by mouth daily.    Marland Kitchen CRANBERRY PO Take 650 mg by mouth 2 (two) times daily.     . Glucosamine-Chondroit-Vit C-Mn (GLUCOSAMINE CHONDR 1500 COMPLX) CAPS Take 2 capsules by mouth daily.    Marland Kitchen ibuprofen (ADVIL,MOTRIN) 600 MG tablet Take 1 tablet (600 mg total) by mouth every 6 (six) hours as needed (mild pain). 30 tablet 0  . lisinopril (ZESTRIL) 2.5 MG tablet Take 2.5 mg by mouth daily.    . metFORMIN (GLUCOPHAGE) 500 MG tablet Take 500-1,000 mg by  mouth See admin instructions. Take 500mg  by mouth in the morning, 500mg  at lunch and 1000mg  at supper time    . polyethylene glycol powder (GLYCOLAX/MIRALAX) powder Take 17 g by mouth every other day.     . sodium chloride (MURO 128) 2 % ophthalmic solution Place 1 drop into both eyes as needed for eye irritation.    . vitamin B-12 (CYANOCOBALAMIN) 1000 MCG tablet Take 1,000 mcg by mouth daily.     No current facility-administered medications for this visit.    Allergies:   Sulfa antibiotics   Social History:  The patient  reports that she has never smoked. She has never used smokeless tobacco. She reports current alcohol use of about 6.0 standard drinks of alcohol per week. She reports that she does not use drugs.   Family History:  The patient's family history includes Alzheimer's disease in her mother; CAD in her father; Diabetes in her father; Heart attack in her father; Heart disease in her maternal grandmother; Hypertension in her father; Prostate cancer in her father.  ROS:  Please see the history of present illness. All other systems are reviewed and otherwise negative.   PHYSICAL EXAM:  VS:  BP (!) 144/94   Pulse (!) 103   Ht 5\' 8"  (1.727 m)   Wt 142 lb (64.4 kg)   SpO2 97%   BMI 21.59 kg/m  BMI: Body mass index is 21.59 kg/m. Well nourished, well developed, in no acute distress  HEENT: normocephalic, atraumatic  Neck: no JVD, carotid bruits or masses Cardiac:  RRR; no significant murmurs, no rubs, or gallops Lungs:  CTA b/l, no wheezing, rhonchi or rales  Abd: soft, nontender MS: no deformity or atrophy Ext: no edema  Skin: warm and dry, no rash Neuro:  No gross deficits appreciated Psych: euthymic mood, full affect  PPM site is stable, no tethering or discomfort   EKG:  Done today and reviewed by myself shows  ST/AT , 103bpm, V paced  PPM interrogation done today and reviewed by myself: Battery and lead measurements are good >99%VP AMS burden <1% Available  EGMs are reviewed, she had 2 that are AFlutter of 2 seconds, otherwise are PACs, or 1:1 (Vpaced) and all are only seconds HR histograms look OK  Recent Labs: No results found for requested labs within last 8760 hours.  No results found for requested labs within last 8760 hours.   CrCl cannot be calculated (Patient's most recent lab result is older than the maximum 21 days allowed.).   Wt Readings from Last 3 Encounters:  02/19/20 142 lb (64.4 kg)  02/10/19  142 lb 12.8 oz (64.8 kg)  01/29/18 139 lb 8.8 oz (63.3 kg)     Other studies reviewed: Additional studies/records reviewed today include: summarized above  ASSESSMENT AND PLAN:  1. PPM     Intact function, no programming changes made  2. HTN     Much better at homoe  3. ST vs an AT     Her HR settled to SR 90's after she was more relaxed, HR histograms look OK     No changes   Disposition: F/u with Q 51mo remotes and in clinic in a year, sooner if neeed  Current medicines are reviewed at length with the patient today.  The patient did not have any concerns regarding medicines.  Venetia Night, PA-C 02/19/2020 7:07 PM     [place] [address] [phone] (office)  [phone] (fax)

## 2020-02-19 ENCOUNTER — Ambulatory Visit: Payer: Medicare Other | Admitting: Physician Assistant

## 2020-02-19 ENCOUNTER — Ambulatory Visit (INDEPENDENT_AMBULATORY_CARE_PROVIDER_SITE_OTHER): Payer: Medicare Other | Admitting: *Deleted

## 2020-02-19 ENCOUNTER — Other Ambulatory Visit: Payer: Self-pay

## 2020-02-19 VITALS — BP 144/94 | HR 103 | Ht 68.0 in | Wt 142.0 lb

## 2020-02-19 DIAGNOSIS — I442 Atrioventricular block, complete: Secondary | ICD-10-CM

## 2020-02-19 DIAGNOSIS — Z950 Presence of cardiac pacemaker: Secondary | ICD-10-CM

## 2020-02-19 DIAGNOSIS — I10 Essential (primary) hypertension: Secondary | ICD-10-CM | POA: Diagnosis not present

## 2020-02-19 LAB — CUP PACEART REMOTE DEVICE CHECK
Battery Remaining Longevity: 40 mo
Battery Remaining Percentage: 29 %
Battery Voltage: 2.81 V
Brady Statistic AP VP Percent: 8.3 %
Brady Statistic AP VS Percent: 1 %
Brady Statistic AS VP Percent: 92 %
Brady Statistic AS VS Percent: 1 %
Brady Statistic RA Percent Paced: 8.2 %
Brady Statistic RV Percent Paced: 99 %
Date Time Interrogation Session: 20210909032205
Implantable Lead Implant Date: 20130917
Implantable Lead Implant Date: 20130917
Implantable Lead Location: 753859
Implantable Lead Location: 753860
Implantable Lead Model: 1944
Implantable Lead Model: 1948
Implantable Pulse Generator Implant Date: 20130917
Lead Channel Impedance Value: 680 Ohm
Lead Channel Impedance Value: 710 Ohm
Lead Channel Pacing Threshold Amplitude: 0.5 V
Lead Channel Pacing Threshold Amplitude: 0.625 V
Lead Channel Pacing Threshold Pulse Width: 0.5 ms
Lead Channel Pacing Threshold Pulse Width: 0.5 ms
Lead Channel Sensing Intrinsic Amplitude: 12 mV
Lead Channel Sensing Intrinsic Amplitude: 3.1 mV
Lead Channel Setting Pacing Amplitude: 0.875
Lead Channel Setting Pacing Amplitude: 2 V
Lead Channel Setting Pacing Pulse Width: 0.5 ms
Lead Channel Setting Sensing Sensitivity: 2 mV
Pulse Gen Model: 2210
Pulse Gen Serial Number: 7387861

## 2020-02-19 NOTE — Patient Instructions (Signed)

## 2020-02-20 NOTE — Progress Notes (Signed)
Remote pacemaker transmission.   

## 2020-05-20 ENCOUNTER — Ambulatory Visit (INDEPENDENT_AMBULATORY_CARE_PROVIDER_SITE_OTHER): Payer: Medicare Other

## 2020-05-20 DIAGNOSIS — I442 Atrioventricular block, complete: Secondary | ICD-10-CM

## 2020-05-21 LAB — CUP PACEART REMOTE DEVICE CHECK
Battery Remaining Longevity: 32 mo
Battery Remaining Percentage: 25 %
Battery Voltage: 2.8 V
Brady Statistic AP VP Percent: 7.4 %
Brady Statistic AP VS Percent: 1 %
Brady Statistic AS VP Percent: 93 %
Brady Statistic AS VS Percent: 1 %
Brady Statistic RA Percent Paced: 7.2 %
Brady Statistic RV Percent Paced: 99 %
Date Time Interrogation Session: 20211209025602
Implantable Lead Implant Date: 20130917
Implantable Lead Implant Date: 20130917
Implantable Lead Location: 753859
Implantable Lead Location: 753860
Implantable Lead Model: 1944
Implantable Lead Model: 1948
Implantable Pulse Generator Implant Date: 20130917
Lead Channel Impedance Value: 600 Ohm
Lead Channel Impedance Value: 610 Ohm
Lead Channel Pacing Threshold Amplitude: 0.5 V
Lead Channel Pacing Threshold Amplitude: 0.625 V
Lead Channel Pacing Threshold Pulse Width: 0.5 ms
Lead Channel Pacing Threshold Pulse Width: 0.5 ms
Lead Channel Sensing Intrinsic Amplitude: 12 mV
Lead Channel Sensing Intrinsic Amplitude: 3 mV
Lead Channel Setting Pacing Amplitude: 0.875
Lead Channel Setting Pacing Amplitude: 2 V
Lead Channel Setting Pacing Pulse Width: 0.5 ms
Lead Channel Setting Sensing Sensitivity: 2 mV
Pulse Gen Model: 2210
Pulse Gen Serial Number: 7387861

## 2020-06-02 NOTE — Progress Notes (Signed)
Remote pacemaker transmission.   

## 2020-08-19 ENCOUNTER — Ambulatory Visit (INDEPENDENT_AMBULATORY_CARE_PROVIDER_SITE_OTHER): Payer: Medicare Other

## 2020-08-19 DIAGNOSIS — I442 Atrioventricular block, complete: Secondary | ICD-10-CM

## 2020-08-23 LAB — CUP PACEART REMOTE DEVICE CHECK
Battery Remaining Longevity: 29 mo
Battery Remaining Percentage: 22 %
Battery Voltage: 2.78 V
Brady Statistic AP VP Percent: 8.1 %
Brady Statistic AP VS Percent: 1 %
Brady Statistic AS VP Percent: 92 %
Brady Statistic AS VS Percent: 1 %
Brady Statistic RA Percent Paced: 8 %
Brady Statistic RV Percent Paced: 99 %
Date Time Interrogation Session: 20220310032758
Implantable Lead Implant Date: 20130917
Implantable Lead Implant Date: 20130917
Implantable Lead Location: 753859
Implantable Lead Location: 753860
Implantable Lead Model: 1944
Implantable Lead Model: 1948
Implantable Pulse Generator Implant Date: 20130917
Lead Channel Impedance Value: 660 Ohm
Lead Channel Impedance Value: 680 Ohm
Lead Channel Pacing Threshold Amplitude: 0.5 V
Lead Channel Pacing Threshold Amplitude: 0.625 V
Lead Channel Pacing Threshold Pulse Width: 0.5 ms
Lead Channel Pacing Threshold Pulse Width: 0.5 ms
Lead Channel Sensing Intrinsic Amplitude: 3.2 mV
Lead Channel Sensing Intrinsic Amplitude: 7.3 mV
Lead Channel Setting Pacing Amplitude: 0.875
Lead Channel Setting Pacing Amplitude: 2 V
Lead Channel Setting Pacing Pulse Width: 0.5 ms
Lead Channel Setting Sensing Sensitivity: 2 mV
Pulse Gen Model: 2210
Pulse Gen Serial Number: 7387861

## 2020-08-27 NOTE — Progress Notes (Signed)
Remote pacemaker transmission.   

## 2020-11-18 ENCOUNTER — Ambulatory Visit (INDEPENDENT_AMBULATORY_CARE_PROVIDER_SITE_OTHER): Payer: Medicare Other

## 2020-11-18 DIAGNOSIS — I442 Atrioventricular block, complete: Secondary | ICD-10-CM

## 2020-11-18 LAB — CUP PACEART REMOTE DEVICE CHECK
Battery Remaining Longevity: 25 mo
Battery Remaining Percentage: 19 %
Battery Voltage: 2.77 V
Brady Statistic AP VP Percent: 9.1 %
Brady Statistic AP VS Percent: 1 %
Brady Statistic AS VP Percent: 91 %
Brady Statistic AS VS Percent: 1 %
Brady Statistic RA Percent Paced: 9 %
Brady Statistic RV Percent Paced: 99 %
Date Time Interrogation Session: 20220609025945
Implantable Lead Implant Date: 20130917
Implantable Lead Implant Date: 20130917
Implantable Lead Location: 753859
Implantable Lead Location: 753860
Implantable Lead Model: 1944
Implantable Lead Model: 1948
Implantable Pulse Generator Implant Date: 20130917
Lead Channel Impedance Value: 640 Ohm
Lead Channel Impedance Value: 660 Ohm
Lead Channel Pacing Threshold Amplitude: 0.5 V
Lead Channel Pacing Threshold Amplitude: 0.625 V
Lead Channel Pacing Threshold Pulse Width: 0.5 ms
Lead Channel Pacing Threshold Pulse Width: 0.5 ms
Lead Channel Sensing Intrinsic Amplitude: 3.1 mV
Lead Channel Sensing Intrinsic Amplitude: 9.5 mV
Lead Channel Setting Pacing Amplitude: 0.875
Lead Channel Setting Pacing Amplitude: 2 V
Lead Channel Setting Pacing Pulse Width: 0.5 ms
Lead Channel Setting Sensing Sensitivity: 2 mV
Pulse Gen Model: 2210
Pulse Gen Serial Number: 7387861

## 2020-12-10 NOTE — Progress Notes (Signed)
Remote pacemaker transmission.   

## 2021-02-17 ENCOUNTER — Other Ambulatory Visit: Payer: Self-pay

## 2021-02-17 ENCOUNTER — Encounter: Payer: Self-pay | Admitting: Student

## 2021-02-17 ENCOUNTER — Ambulatory Visit: Payer: Medicare Other | Admitting: Student

## 2021-02-17 VITALS — BP 132/94 | HR 92 | Ht 67.0 in | Wt 143.2 lb

## 2021-02-17 DIAGNOSIS — Z950 Presence of cardiac pacemaker: Secondary | ICD-10-CM | POA: Diagnosis not present

## 2021-02-17 DIAGNOSIS — I10 Essential (primary) hypertension: Secondary | ICD-10-CM

## 2021-02-17 DIAGNOSIS — I442 Atrioventricular block, complete: Secondary | ICD-10-CM

## 2021-02-17 LAB — CUP PACEART INCLINIC DEVICE CHECK
Battery Remaining Longevity: 10 mo
Battery Voltage: 2.74 V
Brady Statistic RA Percent Paced: 9.6 %
Brady Statistic RV Percent Paced: 99.93 %
Date Time Interrogation Session: 20220908122740
Implantable Lead Implant Date: 20130917
Implantable Lead Implant Date: 20130917
Implantable Lead Location: 753859
Implantable Lead Location: 753860
Implantable Lead Model: 1944
Implantable Lead Model: 1948
Implantable Pulse Generator Implant Date: 20130917
Lead Channel Impedance Value: 662.5 Ohm
Lead Channel Impedance Value: 700 Ohm
Lead Channel Pacing Threshold Amplitude: 0.5 V
Lead Channel Pacing Threshold Amplitude: 0.5 V
Lead Channel Pacing Threshold Amplitude: 0.75 V
Lead Channel Pacing Threshold Pulse Width: 0.5 ms
Lead Channel Pacing Threshold Pulse Width: 0.5 ms
Lead Channel Pacing Threshold Pulse Width: 0.5 ms
Lead Channel Sensing Intrinsic Amplitude: 3 mV
Lead Channel Sensing Intrinsic Amplitude: 7.2 mV
Lead Channel Setting Pacing Amplitude: 1 V
Lead Channel Setting Pacing Amplitude: 2 V
Lead Channel Setting Pacing Pulse Width: 0.5 ms
Lead Channel Setting Sensing Sensitivity: 2 mV
Pulse Gen Model: 2210
Pulse Gen Serial Number: 7387861

## 2021-02-17 NOTE — Progress Notes (Signed)
Electrophysiology Office Note Date: 02/17/2021  ID:  Lauren Norton, DOB 12-17-1945, MRN [id number]  PCP: Prince Solian, MD Primary Cardiologist: None Electrophysiologist: Thompson Grayer, MD   CC: Pacemaker follow-up  Lauren Norton is a 75 y.o. female seen today for Thompson Grayer, MD for routine electrophysiology followup.  Since last being seen in our clinic the patient reports doing well overall. She has labile BP and White coat BP at times. She is on a very low dose of lisinopril. She has mild SOB when she starts to walk up a hill in humid weather, but is able to work through it and feels better after. she denies chest pain, palpitations, dyspnea, PND, orthopnea, nausea, vomiting, dizziness, syncope, edema, weight gain, or early satiety.  Device History: SJM dual chamber PPM implanted 02/27/2012  Past Medical History:  Diagnosis Date   Anxiety    Bronchitis 2018   Cataract 2017   both eyes   Diabetes ([place])    HLD (hyperlipidemia)    HTN (hypertension)    Osteoarthritis of both hips    both hips replaced   Pneumonia    many years ago   Presence of permanent cardiac pacemaker    2013   Second degree AV block    a.s/p MDT dual chamber PPM    Shingles    Skin cancer ~ 2007   "back"   Past Surgical History:  Procedure Laterality Date   ANTERIOR AND POSTERIOR REPAIR N/A 01/29/2018   Procedure: ANTERIOR (CYSTOCELE) AND POSTERIOR REPAIR (RECTOCELE);  Surgeon: Paula Compton, MD;  Location: [facility];  Service: Gynecology;  Laterality: N/A;   CATARACT EXTRACTION, BILATERAL  2017   CYST REMOVAL NECK  2016   CYSTOSCOPY N/A 01/29/2018   Procedure: CYSTOSCOPY;  Surgeon: Paula Compton, MD;  Location: [facility];  Service: Gynecology;  Laterality: N/A;   JOINT REPLACEMENT Bilateral    bilateral hip replacements   left elbow fracture     age 39   PERMANENT PACEMAKER INSERTION N/A 02/27/2012   a. MDT dual chamber PPM implanted by Dr Rayann Heman for Mobitz II   SKIN CANCER  EXCISION  ~ 2007   TONSILLECTOMY AND ADENOIDECTOMY  ~ [place] Bilateral 2001; 2012   right; left   VAGINAL HYSTERECTOMY N/A 01/29/2018   Procedure: HYSTERECTOMY VAGINAL;  Surgeon: Paula Compton, MD;  Location: [facility];  Service: Gynecology;  Laterality: N/A;    Current Outpatient Medications  Medication Sig Dispense Refill   acetaminophen (TYLENOL) 325 MG tablet Take 2 tablets (650 mg total) by mouth every 6 (six) hours as needed for mild pain. 30 tablet 0   alendronate (FOSAMAX) 70 MG tablet Take 70 mg by mouth once a week. Tuesdays     atorvastatin (LIPITOR) 10 MG tablet Take 10 mg by mouth daily with supper.      Biotin 5000 MCG TABS Take 1 tablet by mouth daily.     calcium citrate-vitamin D (CITRACAL+D) 315-200 MG-UNIT tablet Take 1 tablet by mouth 2 (two) times daily.     cetirizine (ZYRTEC) 10 MG tablet Take 5 mg by mouth at bedtime as needed for allergies.      CINNAMON PO Take 650 mg by mouth daily.      CRANBERRY PO Take 650 mg by mouth 2 (two) times daily.      ibuprofen (ADVIL,MOTRIN) 600 MG tablet Take 1 tablet (600 mg total) by mouth every 6 (six) hours as needed (mild pain). 30 tablet 0  lisinopril (ZESTRIL) 2.5 MG tablet Take 0.5 tablets by mouth daily at 6 (six) AM.     metFORMIN (GLUCOPHAGE) 500 MG tablet Take 500-1,000 mg by mouth See admin instructions. Take '500mg'$  by mouth in the morning, '500mg'$  at lunch and '1000mg'$  at supper time     polyethylene glycol powder (GLYCOLAX/MIRALAX) powder Take 17 g by mouth every other day.     Pumpkin Seed-Soy Germ (AZO BLADDER CONTROL/GO-LESS) CAPS Take 2 tablets by mouth daily.     sodium chloride (MURO 128) 2 % ophthalmic solution Place 1 drop into both eyes as needed for eye irritation.     tretinoin (RETIN-A) 0.05 % cream Apply topically at bedtime.     No current facility-administered medications for this visit.    Allergies:   Sulfa antibiotics   Social History: Social History   Socioeconomic History    Marital status: Married    Spouse name: Not on file   Number of children: 3   Years of education: Not on file   Highest education level: Not on file  Occupational History   Occupation: retired Pharmacist, hospital  Tobacco Use   Smoking status: Never   Smokeless tobacco: Never  Vaping Use   Vaping Use: Never used  Substance and Sexual Activity   Alcohol use: Yes    Alcohol/week: 6.0 standard drinks    Types: 2 Standard drinks or equivalent, 4 Glasses of wine per week    Comment: glass of wine ~ 5 nights/wk; margarita q Fri night"   Drug use: No   Sexual activity: Yes  Other Topics Concern   Not on file  Social History Narrative   Pt lives in [place].  Retired.   Social Determinants of Health   Financial Resource Strain: Not on file  Food Insecurity: Not on file  Transportation Needs: Not on file  Physical Activity: Not on file  Stress: Not on file  Social Connections: Not on file  Intimate Partner Violence: Not on file    Family History: Family History  Problem Relation Age of Onset   Heart attack Father    Diabetes Father    Prostate cancer Father    CAD Father    Hypertension Father    Heart disease Maternal Grandmother    Alzheimer's disease Mother      Review of Systems: All other systems reviewed and are otherwise negative except as noted above.  Physical Exam: Vitals:   02/17/21 1145  BP: (!) 132/94  Pulse: 92  SpO2: 96%  Weight: 143 lb 3.2 oz (65 kg)  Height: '5\' 7"'$  (1.702 m)     GEN- The patient is well appearing, alert and oriented x 3 today.   HEENT: normocephalic, atraumatic; sclera clear, conjunctiva pink; hearing intact; oropharynx clear; neck supple  Lungs- Clear to ausculation bilaterally, normal work of breathing.  No wheezes, rales, rhonchi Heart- Regular rate and rhythm, no murmurs, rubs or gallops  GI- soft, non-tender, non-distended, bowel sounds present  Extremities- no clubbing or cyanosis. No edema MS- no significant deformity or  atrophy Skin- warm and dry, no rash or lesion; PPM pocket well healed Psych- euthymic mood, full affect Neuro- strength and sensation are intact  PPM Interrogation- reviewed in detail today,  See PACEART report  EKG:  EKG is ordered today. Personal review of ekg ordered today shows AS VP at 92 bpm, pacing    Recent Labs: No results found for requested labs within last 8760 hours.   Wt Readings from Last 3 Encounters:  02/17/21 143 lb 3.2 oz (65 kg)  02/19/20 142 lb (64.4 kg)  02/10/19 142 lb 12.8 oz (64.8 kg)     Other studies Reviewed: Additional studies/ records that were reviewed today include: Previous EP office notes, Previous remote checks, Most recent labwork.   Assessment and Plan:  1. 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 PPM  Normal PPM function See Pace Art report No changes today She has had occasional atrial lead noise. Minimally reproducible on exam today with pushing palms into each other. Can be seen incidentally on AT/ST EGMs at least as far back as 02/19/2020, where it is not always sensed.  Follow.  She jumped from > 2 years to 10 mo of battery life. Have previously discussed with [employer] that this was due to a recent software upgrade that will more accurately predict ERI. No cause for concern.   2. HTN Stable at home  3. ST vs AT Histograms stable. Low burden, short episodes. No changes at this time.  Current medicines are reviewed at length with the patient today.   The patient does not have concerns regarding her medicines.  The following changes were made today:  none  Labs/ tests ordered today include:  Orders Placed This Encounter  Procedures   EKG 12-Lead    Disposition:   Follow up with Dr. Rayann Heman in 11 Months, unless meets ERI before then.    Jacalyn Lefevre, PA-C  02/17/2021 12:20 PM  [place] [address] [phone]  (office) [phone] (fax)

## 2021-02-17 NOTE — Patient Instructions (Signed)
Medication Instructions:  Your physician recommends that you continue on your current medications as directed. Please refer to the Current Medication list given to you today.  *If you need a refill on your cardiac medications before your next appointment, please call your pharmacy*   Lab Work: None If you have labs (blood work) drawn today and your tests are completely normal, you will receive your results only by: [place] (if you have MyChart) OR A paper copy in the mail If you have any lab test that is abnormal or we need to change your treatment, we will call you to review the results.  Follow-Up: At [facility], you and your health needs are our priority.  As part of our continuing mission to provide you with exceptional heart care, we have created designated Provider Care Teams.  These Care Teams include your primary Cardiologist (physician) and Advanced Practice Providers (APPs -  Physician Assistants and Nurse Practitioners) who all work together to provide you with the care you need, when you need it.  Your next appointment:   11 month(s)  The format for your next appointment:   In Person  Provider:   Thompson Grayer, MD

## 2021-02-18 ENCOUNTER — Encounter: Payer: Medicare Other | Admitting: Physician Assistant

## 2021-03-22 ENCOUNTER — Ambulatory Visit (INDEPENDENT_AMBULATORY_CARE_PROVIDER_SITE_OTHER): Payer: Medicare Other

## 2021-03-22 DIAGNOSIS — I442 Atrioventricular block, complete: Secondary | ICD-10-CM | POA: Diagnosis not present

## 2021-03-22 LAB — CUP PACEART REMOTE DEVICE CHECK
Battery Remaining Longevity: 8 mo
Battery Remaining Percentage: 6 %
Battery Voltage: 2.74 V
Brady Statistic AP VP Percent: 9.3 %
Brady Statistic AP VS Percent: 1 %
Brady Statistic AS VP Percent: 91 %
Brady Statistic AS VS Percent: 1 %
Brady Statistic RA Percent Paced: 9.2 %
Brady Statistic RV Percent Paced: 99 %
Date Time Interrogation Session: 20221011034056
Implantable Lead Implant Date: 20130917
Implantable Lead Implant Date: 20130917
Implantable Lead Location: 753859
Implantable Lead Location: 753860
Implantable Lead Model: 1944
Implantable Lead Model: 1948
Implantable Pulse Generator Implant Date: 20130917
Lead Channel Impedance Value: 490 Ohm
Lead Channel Impedance Value: 580 Ohm
Lead Channel Pacing Threshold Amplitude: 0.5 V
Lead Channel Pacing Threshold Amplitude: 0.5 V
Lead Channel Pacing Threshold Pulse Width: 0.5 ms
Lead Channel Pacing Threshold Pulse Width: 0.5 ms
Lead Channel Sensing Intrinsic Amplitude: 1.8 mV
Lead Channel Sensing Intrinsic Amplitude: 7.2 mV
Lead Channel Setting Pacing Amplitude: 0.75 V
Lead Channel Setting Pacing Amplitude: 2 V
Lead Channel Setting Pacing Pulse Width: 0.5 ms
Lead Channel Setting Sensing Sensitivity: 2 mV
Pulse Gen Model: 2210
Pulse Gen Serial Number: 7387861

## 2021-03-30 NOTE — Progress Notes (Signed)
Remote pacemaker transmission.   

## 2021-04-25 ENCOUNTER — Ambulatory Visit (INDEPENDENT_AMBULATORY_CARE_PROVIDER_SITE_OTHER): Payer: Medicare Other

## 2021-04-25 DIAGNOSIS — I442 Atrioventricular block, complete: Secondary | ICD-10-CM

## 2021-04-26 LAB — CUP PACEART REMOTE DEVICE CHECK
Battery Remaining Longevity: 8 mo
Battery Remaining Percentage: 5 %
Battery Voltage: 2.72 V
Brady Statistic AP VP Percent: 10 %
Brady Statistic AP VS Percent: 1 %
Brady Statistic AS VP Percent: 90 %
Brady Statistic AS VS Percent: 1 %
Brady Statistic RA Percent Paced: 10 %
Brady Statistic RV Percent Paced: 99 %
Date Time Interrogation Session: 20221114020012
Implantable Lead Implant Date: 20130917
Implantable Lead Implant Date: 20130917
Implantable Lead Location: 753859
Implantable Lead Location: 753860
Implantable Lead Model: 1944
Implantable Lead Model: 1948
Implantable Pulse Generator Implant Date: 20130917
Lead Channel Impedance Value: 650 Ohm
Lead Channel Impedance Value: 660 Ohm
Lead Channel Pacing Threshold Amplitude: 0.5 V
Lead Channel Pacing Threshold Amplitude: 0.5 V
Lead Channel Pacing Threshold Pulse Width: 0.5 ms
Lead Channel Pacing Threshold Pulse Width: 0.5 ms
Lead Channel Sensing Intrinsic Amplitude: 2.4 mV
Lead Channel Sensing Intrinsic Amplitude: 7.2 mV
Lead Channel Setting Pacing Amplitude: 0.75 V
Lead Channel Setting Pacing Amplitude: 2 V
Lead Channel Setting Pacing Pulse Width: 0.5 ms
Lead Channel Setting Sensing Sensitivity: 2 mV
Pulse Gen Model: 2210
Pulse Gen Serial Number: 7387861

## 2021-05-02 NOTE — Progress Notes (Signed)
Remote pacemaker transmission.   

## 2021-05-02 NOTE — Addendum Note (Signed)
Addended by: Douglass Rivers D on: 05/02/2021 11:40 AM   Modules accepted: Level of Service

## 2021-07-04 ENCOUNTER — Ambulatory Visit (INDEPENDENT_AMBULATORY_CARE_PROVIDER_SITE_OTHER): Payer: Medicare Other

## 2021-07-04 DIAGNOSIS — I442 Atrioventricular block, complete: Secondary | ICD-10-CM

## 2021-07-05 LAB — CUP PACEART REMOTE DEVICE CHECK
Battery Remaining Longevity: 5 mo
Battery Remaining Percentage: 3 %
Battery Voltage: 2.69 V
Brady Statistic AP VP Percent: 9.5 %
Brady Statistic AP VS Percent: 1 %
Brady Statistic AS VP Percent: 90 %
Brady Statistic AS VS Percent: 1 %
Brady Statistic RA Percent Paced: 9.4 %
Brady Statistic RV Percent Paced: 99 %
Date Time Interrogation Session: 20230123171536
Implantable Lead Implant Date: 20130917
Implantable Lead Implant Date: 20130917
Implantable Lead Location: 753859
Implantable Lead Location: 753860
Implantable Lead Model: 1944
Implantable Lead Model: 1948
Implantable Pulse Generator Implant Date: 20130917
Lead Channel Impedance Value: 580 Ohm
Lead Channel Impedance Value: 650 Ohm
Lead Channel Pacing Threshold Amplitude: 0.5 V
Lead Channel Pacing Threshold Amplitude: 0.625 V
Lead Channel Pacing Threshold Pulse Width: 0.5 ms
Lead Channel Pacing Threshold Pulse Width: 0.5 ms
Lead Channel Sensing Intrinsic Amplitude: 3.1 mV
Lead Channel Sensing Intrinsic Amplitude: 9.6 mV
Lead Channel Setting Pacing Amplitude: 0.875
Lead Channel Setting Pacing Amplitude: 2 V
Lead Channel Setting Pacing Pulse Width: 0.5 ms
Lead Channel Setting Sensing Sensitivity: 2 mV
Pulse Gen Model: 2210
Pulse Gen Serial Number: 7387861

## 2021-07-08 ENCOUNTER — Encounter: Payer: Self-pay | Admitting: Internal Medicine

## 2021-07-14 NOTE — Progress Notes (Signed)
Remote pacemaker transmission.   

## 2021-08-08 ENCOUNTER — Ambulatory Visit (INDEPENDENT_AMBULATORY_CARE_PROVIDER_SITE_OTHER): Payer: Medicare Other

## 2021-08-08 DIAGNOSIS — I442 Atrioventricular block, complete: Secondary | ICD-10-CM

## 2021-08-09 LAB — CUP PACEART REMOTE DEVICE CHECK
Battery Remaining Longevity: 4 mo
Battery Remaining Percentage: 2 %
Battery Voltage: 2.68 V
Brady Statistic AP VP Percent: 8.6 %
Brady Statistic AP VS Percent: 1 %
Brady Statistic AS VP Percent: 91 %
Brady Statistic AS VS Percent: 1 %
Brady Statistic RA Percent Paced: 8.6 %
Brady Statistic RV Percent Paced: 99 %
Date Time Interrogation Session: 20230227172710
Implantable Lead Implant Date: 20130917
Implantable Lead Implant Date: 20130917
Implantable Lead Location: 753859
Implantable Lead Location: 753860
Implantable Lead Model: 1944
Implantable Lead Model: 1948
Implantable Pulse Generator Implant Date: 20130917
Lead Channel Impedance Value: 590 Ohm
Lead Channel Impedance Value: 680 Ohm
Lead Channel Pacing Threshold Amplitude: 0.5 V
Lead Channel Pacing Threshold Amplitude: 0.625 V
Lead Channel Pacing Threshold Pulse Width: 0.5 ms
Lead Channel Pacing Threshold Pulse Width: 0.5 ms
Lead Channel Sensing Intrinsic Amplitude: 2 mV
Lead Channel Sensing Intrinsic Amplitude: 9.6 mV
Lead Channel Setting Pacing Amplitude: 0.875
Lead Channel Setting Pacing Amplitude: 2 V
Lead Channel Setting Pacing Pulse Width: 0.5 ms
Lead Channel Setting Sensing Sensitivity: 2 mV
Pulse Gen Model: 2210
Pulse Gen Serial Number: 7387861

## 2021-08-12 NOTE — Progress Notes (Signed)
Remote pacemaker transmission.   

## 2021-08-12 NOTE — Addendum Note (Signed)
Addended by: Douglass Rivers D on: 08/12/2021 01:23 PM   Modules accepted: Level of Service

## 2021-09-12 ENCOUNTER — Ambulatory Visit (INDEPENDENT_AMBULATORY_CARE_PROVIDER_SITE_OTHER): Payer: Medicare Other

## 2021-09-12 DIAGNOSIS — I442 Atrioventricular block, complete: Secondary | ICD-10-CM

## 2021-09-13 LAB — CUP PACEART REMOTE DEVICE CHECK
Battery Remaining Longevity: 3 mo
Battery Remaining Percentage: 2 %
Battery Voltage: 2.66 V
Brady Statistic AP VP Percent: 8.2 %
Brady Statistic AP VS Percent: 1 %
Brady Statistic AS VP Percent: 92 %
Brady Statistic AS VS Percent: 1 %
Brady Statistic RA Percent Paced: 8.1 %
Brady Statistic RV Percent Paced: 99 %
Date Time Interrogation Session: 20230403093828
Implantable Lead Implant Date: 20130917
Implantable Lead Implant Date: 20130917
Implantable Lead Location: 753859
Implantable Lead Location: 753860
Implantable Lead Model: 1944
Implantable Lead Model: 1948
Implantable Pulse Generator Implant Date: 20130917
Lead Channel Impedance Value: 580 Ohm
Lead Channel Impedance Value: 660 Ohm
Lead Channel Pacing Threshold Amplitude: 0.5 V
Lead Channel Pacing Threshold Amplitude: 0.75 V
Lead Channel Pacing Threshold Pulse Width: 0.5 ms
Lead Channel Pacing Threshold Pulse Width: 0.5 ms
Lead Channel Sensing Intrinsic Amplitude: 2.2 mV
Lead Channel Sensing Intrinsic Amplitude: 9.6 mV
Lead Channel Setting Pacing Amplitude: 1 V
Lead Channel Setting Pacing Amplitude: 2 V
Lead Channel Setting Pacing Pulse Width: 0.5 ms
Lead Channel Setting Sensing Sensitivity: 2 mV
Pulse Gen Model: 2210
Pulse Gen Serial Number: 7387861

## 2021-09-27 NOTE — Addendum Note (Signed)
Addended by: Cheri Kearns A on: 09/27/2021 10:17 AM ? ? Modules accepted: Level of Service ? ?

## 2021-09-27 NOTE — Progress Notes (Signed)
Remote pacemaker transmission.   

## 2021-10-17 ENCOUNTER — Ambulatory Visit (INDEPENDENT_AMBULATORY_CARE_PROVIDER_SITE_OTHER): Payer: Medicare Other

## 2021-10-17 DIAGNOSIS — I442 Atrioventricular block, complete: Secondary | ICD-10-CM | POA: Diagnosis not present

## 2021-10-18 LAB — CUP PACEART REMOTE DEVICE CHECK
Battery Remaining Longevity: 1 mo
Battery Remaining Percentage: 1 %
Battery Voltage: 2.65 V
Brady Statistic AP VP Percent: 7.8 %
Brady Statistic AP VS Percent: 1 %
Brady Statistic AS VP Percent: 92 %
Brady Statistic AS VS Percent: 1 %
Brady Statistic RA Percent Paced: 7.7 %
Brady Statistic RV Percent Paced: 99 %
Date Time Interrogation Session: 20230508075901
Implantable Lead Implant Date: 20130917
Implantable Lead Implant Date: 20130917
Implantable Lead Location: 753859
Implantable Lead Location: 753860
Implantable Lead Model: 1944
Implantable Lead Model: 1948
Implantable Pulse Generator Implant Date: 20130917
Lead Channel Impedance Value: 490 Ohm
Lead Channel Impedance Value: 630 Ohm
Lead Channel Pacing Threshold Amplitude: 0.5 V
Lead Channel Pacing Threshold Amplitude: 0.75 V
Lead Channel Pacing Threshold Pulse Width: 0.5 ms
Lead Channel Pacing Threshold Pulse Width: 0.5 ms
Lead Channel Sensing Intrinsic Amplitude: 1.5 mV
Lead Channel Sensing Intrinsic Amplitude: 9.6 mV
Lead Channel Setting Pacing Amplitude: 1 V
Lead Channel Setting Pacing Amplitude: 2 V
Lead Channel Setting Pacing Pulse Width: 0.5 ms
Lead Channel Setting Sensing Sensitivity: 2 mV
Pulse Gen Model: 2210
Pulse Gen Serial Number: 7387861

## 2021-10-27 ENCOUNTER — Encounter: Payer: Self-pay | Admitting: Internal Medicine

## 2021-11-01 NOTE — Progress Notes (Signed)
Remote pacemaker transmission.   

## 2021-11-23 ENCOUNTER — Ambulatory Visit (INDEPENDENT_AMBULATORY_CARE_PROVIDER_SITE_OTHER): Payer: Medicare Other

## 2021-11-23 DIAGNOSIS — I442 Atrioventricular block, complete: Secondary | ICD-10-CM

## 2021-11-26 LAB — CUP PACEART REMOTE DEVICE CHECK
Battery Remaining Longevity: 1 mo
Battery Remaining Percentage: 0.5 %
Battery Voltage: 2.62 V
Brady Statistic AP VP Percent: 7.9 %
Brady Statistic AP VS Percent: 1 %
Brady Statistic AS VP Percent: 92 %
Brady Statistic AS VS Percent: 1 %
Brady Statistic RA Percent Paced: 7.8 %
Brady Statistic RV Percent Paced: 99 %
Date Time Interrogation Session: 20230614124521
Implantable Lead Implant Date: 20130917
Implantable Lead Implant Date: 20130917
Implantable Lead Location: 753859
Implantable Lead Location: 753860
Implantable Lead Model: 1944
Implantable Lead Model: 1948
Implantable Pulse Generator Implant Date: 20130917
Lead Channel Impedance Value: 630 Ohm
Lead Channel Impedance Value: 640 Ohm
Lead Channel Pacing Threshold Amplitude: 0.5 V
Lead Channel Pacing Threshold Amplitude: 0.625 V
Lead Channel Pacing Threshold Pulse Width: 0.5 ms
Lead Channel Pacing Threshold Pulse Width: 0.5 ms
Lead Channel Sensing Intrinsic Amplitude: 2.9 mV
Lead Channel Sensing Intrinsic Amplitude: 9.6 mV
Lead Channel Setting Pacing Amplitude: 0.875
Lead Channel Setting Pacing Amplitude: 2 V
Lead Channel Setting Pacing Pulse Width: 0.5 ms
Lead Channel Setting Sensing Sensitivity: 2 mV
Pulse Gen Model: 2210
Pulse Gen Serial Number: 7387861

## 2021-12-03 ENCOUNTER — Encounter: Payer: Self-pay | Admitting: Internal Medicine

## 2021-12-06 NOTE — Progress Notes (Signed)
Remote pacemaker transmission.   

## 2021-12-26 ENCOUNTER — Ambulatory Visit (INDEPENDENT_AMBULATORY_CARE_PROVIDER_SITE_OTHER): Payer: Medicare Other

## 2021-12-26 ENCOUNTER — Telehealth: Payer: Self-pay | Admitting: Internal Medicine

## 2021-12-26 DIAGNOSIS — I442 Atrioventricular block, complete: Secondary | ICD-10-CM

## 2021-12-28 LAB — CUP PACEART REMOTE DEVICE CHECK
Battery Remaining Longevity: 1 mo
Battery Remaining Percentage: 0.5 %
Battery Voltage: 2.6 V
Brady Statistic AP VP Percent: 8.2 %
Brady Statistic AP VS Percent: 1 %
Brady Statistic AS VP Percent: 92 %
Brady Statistic AS VS Percent: 1 %
Brady Statistic RA Percent Paced: 8.1 %
Brady Statistic RV Percent Paced: 99 %
Date Time Interrogation Session: 20230717072138
Implantable Lead Implant Date: 20130917
Implantable Lead Implant Date: 20130917
Implantable Lead Location: 753859
Implantable Lead Location: 753860
Implantable Lead Model: 1944
Implantable Lead Model: 1948
Implantable Pulse Generator Implant Date: 20130917
Lead Channel Impedance Value: 550 Ohm
Lead Channel Impedance Value: 630 Ohm
Lead Channel Pacing Threshold Amplitude: 0.5 V
Lead Channel Pacing Threshold Amplitude: 0.625 V
Lead Channel Pacing Threshold Pulse Width: 0.5 ms
Lead Channel Pacing Threshold Pulse Width: 0.5 ms
Lead Channel Sensing Intrinsic Amplitude: 1.2 mV
Lead Channel Sensing Intrinsic Amplitude: 9.6 mV
Lead Channel Setting Pacing Amplitude: 0.875
Lead Channel Setting Pacing Amplitude: 2 V
Lead Channel Setting Pacing Pulse Width: 0.5 ms
Lead Channel Setting Sensing Sensitivity: 2 mV
Pulse Gen Model: 2210
Pulse Gen Serial Number: 7387861

## 2022-01-03 NOTE — Telephone Encounter (Signed)
Hi Dr. Henrene Pastor,  Patient called requesting a transfer of care she said her husband is a patient of Dr. Bryan Lemma and she would like to continue her care with him so they have the same provider.  Would you accept the request?   Thanks

## 2022-01-03 NOTE — Telephone Encounter (Signed)
Sure, no problem. 

## 2022-01-04 NOTE — Telephone Encounter (Signed)
Hi Dr. Bryan Lemma,   Would you accept this transfer below?   Thanks

## 2022-01-09 NOTE — Telephone Encounter (Signed)
Yes, ok with me to transfer care. Thanks.

## 2022-01-11 ENCOUNTER — Encounter: Payer: Self-pay | Admitting: Gastroenterology

## 2022-01-23 NOTE — Addendum Note (Signed)
Addended by: Douglass Rivers D on: 01/23/2022 11:02 AM   Modules accepted: Level of Service

## 2022-01-23 NOTE — Progress Notes (Signed)
Remote pacemaker transmission.   

## 2022-01-30 ENCOUNTER — Ambulatory Visit (INDEPENDENT_AMBULATORY_CARE_PROVIDER_SITE_OTHER): Payer: Medicare Other

## 2022-01-30 DIAGNOSIS — I442 Atrioventricular block, complete: Secondary | ICD-10-CM

## 2022-02-01 ENCOUNTER — Telehealth: Payer: Self-pay

## 2022-02-01 LAB — CUP PACEART REMOTE DEVICE CHECK
Battery Remaining Longevity: 1 mo
Battery Remaining Percentage: 0.5 %
Battery Voltage: 2.59 V
Brady Statistic AP VP Percent: 7.9 %
Brady Statistic AP VS Percent: 1 %
Brady Statistic AS VP Percent: 92 %
Brady Statistic AS VS Percent: 1 %
Brady Statistic RA Percent Paced: 7.8 %
Brady Statistic RV Percent Paced: 99 %
Date Time Interrogation Session: 20230821075655
Implantable Lead Implant Date: 20130917
Implantable Lead Implant Date: 20130917
Implantable Lead Location: 753859
Implantable Lead Location: 753860
Implantable Lead Model: 1944
Implantable Lead Model: 1948
Implantable Pulse Generator Implant Date: 20130917
Lead Channel Impedance Value: 590 Ohm
Lead Channel Impedance Value: 660 Ohm
Lead Channel Pacing Threshold Amplitude: 0.5 V
Lead Channel Pacing Threshold Amplitude: 1.125 V
Lead Channel Pacing Threshold Pulse Width: 0.5 ms
Lead Channel Pacing Threshold Pulse Width: 0.5 ms
Lead Channel Sensing Intrinsic Amplitude: 1.7 mV
Lead Channel Sensing Intrinsic Amplitude: 9.6 mV
Lead Channel Setting Pacing Amplitude: 1.375
Lead Channel Setting Pacing Amplitude: 2 V
Lead Channel Setting Pacing Pulse Width: 0.5 ms
Lead Channel Setting Sensing Sensitivity: 2 mV
Pulse Gen Model: 2210
Pulse Gen Serial Number: 7387861

## 2022-02-01 NOTE — Telephone Encounter (Signed)
Spoke with patient informed her of PPM reaching ERI patient agreeable to appointment with R. Charlcie Cradle on 02/17/22 at 2:20

## 2022-02-08 ENCOUNTER — Encounter: Payer: Self-pay | Admitting: Gastroenterology

## 2022-02-08 ENCOUNTER — Ambulatory Visit: Payer: Medicare Other | Admitting: Gastroenterology

## 2022-02-08 VITALS — BP 156/88 | HR 98 | Ht 66.0 in | Wt 145.2 lb

## 2022-02-08 DIAGNOSIS — K641 Second degree hemorrhoids: Secondary | ICD-10-CM | POA: Diagnosis not present

## 2022-02-08 DIAGNOSIS — K5909 Other constipation: Secondary | ICD-10-CM | POA: Diagnosis not present

## 2022-02-08 NOTE — Patient Instructions (Addendum)
If you are age 76 or younger, your body mass index should be between 19-25. Your Body mass index is 23.44 kg/m. If this is out of the aformentioned range listed, please consider follow up with your Primary Care Provider.   __________________________________________________________  The [facility] providers would like to encourage you to use [facility] to communicate with providers for non-urgent requests or questions.  Due to long hold times on the telephone, sending your provider a message by [facility] may be a faster and more efficient way to get a response.  Please allow 48 business hours for a response.  Please remember that this is for non-urgent requests.   Due to recent changes in healthcare laws, you may see the results of your imaging and laboratory studies on MyChart before your provider has had a chance to review them.  We understand that in some cases there may be results that are confusing or concerning to you. Not all laboratory results come back in the same time frame and the provider may be waiting for multiple results in order to interpret others.  Please give [place] 48 hours in order for your provider to thoroughly review all the results before contacting the office for clarification of your results.   You have been scheduled for an appointment with Dr. Bryan Lemma on 03/17/22 at 10:40 am . Please arrive 10 minutes early for your appointment.   Thank you for choosing me and [facility].  Vito Cirigliano, D.O.

## 2022-02-08 NOTE — Progress Notes (Signed)
Chief Complaint: Symptomatic hemorrhoids   Referring Provider:     Prince Solian, MD   HPI:     Lauren Norton is a 76 y.o. 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   History of chronic constipation, which is generally well controlled with high-fiber diet and MiraLAX.  Uses Miralax prn. Constipation since childhood.   History of hemorrhoids, which she has historically treated with topical hydrocortisone prn. More relief with A+D ointment.  Has had hemorrhoidal sxs for 10+ years. Worse with exacerbation of constipation. Index sxs of itching, irritation, seepage/incomplete cleaning ; rare scant BRBPR.   Was seen in 2016 for hemorrhoidal sxs and constipation. Colonoscopy at that time with a single benign polyp and internal hemorrhoids.   Driving to [place] this weekend for family reunion.  Presents to the office with her husband.  Endoscopic History: - Colonoscopy in [place] in the early 2000's which was reportedly normal/no polyps - Colonoscopy (10/30/2014): 3 mm descending colon polyp (path: Benign lymphoid aggregate), Internal hemorrhoids.  Repeat in 10 years   Past Medical History:  Diagnosis Date   Anxiety    Bronchitis 2018   Cataract 2017   both eyes   Diabetes ([place])    HLD (hyperlipidemia)    HTN (hypertension)    Osteoarthritis of both hips    both hips replaced   Pneumonia    many years ago   Presence of permanent cardiac pacemaker    2013   Second degree AV block    a.s/p MDT dual chamber PPM    Shingles    Skin cancer ~ 2007   "back"     Past Surgical History:  Procedure Laterality Date   ANTERIOR AND POSTERIOR REPAIR N/A 01/29/2018   Procedure: ANTERIOR (CYSTOCELE) AND POSTERIOR REPAIR (RECTOCELE);  Surgeon: Paula Compton, MD;  Location: [facility];  Service: Gynecology;  Laterality: N/A;   CATARACT EXTRACTION,  BILATERAL  2017   CYST REMOVAL NECK  2016   CYSTOSCOPY N/A 01/29/2018   Procedure: CYSTOSCOPY;  Surgeon: Paula Compton, MD;  Location: [facility];  Service: Gynecology;  Laterality: N/A;   JOINT REPLACEMENT Bilateral    bilateral hip replacements   left elbow fracture     age 67   PERMANENT PACEMAKER INSERTION N/A 02/27/2012   a. MDT dual chamber PPM implanted by Dr Rayann Heman for Mobitz II   SKIN CANCER EXCISION  ~ 2007   TONSILLECTOMY AND ADENOIDECTOMY  ~ [place] Bilateral 2001; 2012   right; left   VAGINAL HYSTERECTOMY N/A 01/29/2018   Procedure: HYSTERECTOMY VAGINAL;  Surgeon: Paula Compton, MD;  Location: [facility];  Service: Gynecology;  Laterality: N/A;   Family History  Problem Relation Age of Onset   Alzheimer's disease Mother    Heart attack Father    Diabetes Father    Prostate cancer Father    CAD Father    Hypertension Father    Heart disease Maternal Grandmother    Colon cancer Neg Hx    Esophageal cancer Neg Hx    Liver cancer Neg Hx    Pancreatic cancer Neg Hx    Rectal cancer Neg Hx    Stomach cancer Neg Hx    Social History   Tobacco Use   Smoking status: Never   Smokeless tobacco: Never  Vaping Use  Vaping Use: Never used  Substance Use Topics   Alcohol use: Yes    Alcohol/week: 6.0 standard drinks of alcohol    Types: 2 Standard drinks or equivalent, 4 Glasses of wine per week    Comment: glass of wine ~ 5 nights/wk; margarita q Fri night"   Drug use: No   Current Outpatient Medications  Medication Sig Dispense Refill   acetaminophen (TYLENOL) 325 MG tablet Take 2 tablets (650 mg total) by mouth every 6 (six) hours as needed for mild pain. 30 tablet 0   alendronate (FOSAMAX) 70 MG tablet Take 70 mg by mouth once a week. Tuesdays     atorvastatin (LIPITOR) 10 MG tablet Take 10 mg by mouth daily with supper.      Biotin 5000 MCG TABS Take 1 tablet by mouth daily.     calcium citrate-vitamin D (CITRACAL+D) 315-200 MG-UNIT tablet  Take 1 tablet by mouth 2 (two) times daily.     cetirizine (ZYRTEC) 10 MG tablet Take 5 mg by mouth at bedtime as needed for allergies.      CRANBERRY PO Take 650 mg by mouth 2 (two) times daily.      ibuprofen (ADVIL,MOTRIN) 600 MG tablet Take 1 tablet (600 mg total) by mouth every 6 (six) hours as needed (mild pain). 30 tablet 0   lisinopril (ZESTRIL) 2.5 MG tablet Take 0.5 tablets by mouth daily at 6 (six) AM.     metFORMIN (GLUCOPHAGE) 500 MG tablet Take 500-1,000 mg by mouth See admin instructions. Take '500mg'$  by mouth in the morning, '500mg'$  at lunch and '1000mg'$  at supper time     polyethylene glycol powder (GLYCOLAX/MIRALAX) powder Take 17 g by mouth every other day.     Pumpkin Seed-Soy Germ (AZO BLADDER CONTROL/GO-LESS) CAPS Take 2 tablets by mouth daily.     sodium chloride (MURO 128) 2 % ophthalmic solution Place 1 drop into both eyes as needed for eye irritation.     tretinoin (RETIN-A) 0.05 % cream Apply topically at bedtime.     CINNAMON PO Take 650 mg by mouth daily.      No current facility-administered medications for this visit.   Allergies  Allergen Reactions   Sulfa Antibiotics Itching     Review of Systems: All systems reviewed and negative except where noted in HPI.     Physical Exam:    Wt Readings from Last 3 Encounters:  02/08/22 145 lb 3.2 oz (65.9 kg)  02/17/21 143 lb 3.2 oz (65 kg)  02/19/20 142 lb (64.4 kg)    BP (!) 156/88 (BP Location: Right Arm, Patient Position: Sitting, Cuff Size: Normal)   Pulse 98   Ht '5\' 6"'$  (1.676 m)   Wt 145 lb 3.2 oz (65.9 kg)   SpO2 98%   BMI 23.44 kg/m  Constitutional:  Pleasant, in no acute distress. Neurological: Alert and oriented to person place and time. Skin: Skin is warm and dry. No rashes noted. Rectal exam: Sensation intact and preserved anal wink.  Small external skin tags.  No external anal fissures, external hemorrhoids. Normal sphincter tone. No palpable mass. No blood on the exam glove.  Anoscopy with grade  2 hemorrhoids in all positions.  (Chaperone: Renee Rival, CMA).     ASSESSMENT AND PLAN;   1) Internal hemorrhoids 76 year old female with longstanding history of internal hemorrhoids.  Exam today with grade 2 hemorrhoids in all positions.  She would be amenable to hemorrhoid band ligation therapy.  Since she is traveling to [place]  in a couple of days (and driving), we decided to postpone hemorrhoid banding until she is back in town.  In the interim, continue conservative management. - We discussed the risks/benefits and alternatives of hemorrhoid banding and she would like to proceed - Will schedule appointment for hemorrhoid banding - Had colonoscopy in 2016 for essentially the same symptoms.  Do not feel colonoscopy needs to be repeated at this juncture  2) Chronic constipation -Continue high-fiber diet with MiraLAX prn - Continue adequate hydration     Lavena Bullion, DO, FACG  02/08/2022, 8:29 AM   Prince Solian, MD

## 2022-02-16 NOTE — Progress Notes (Addendum)
Cardiology Office Note Date:  02/16/2022  Patient ID:  Lauren Norton, DOB [birth year], MRN [id number] PCP:  Prince Solian, MD  Cardiologist/Electrophysiologist: Dr. Rayann Heman    Chief Complaint:   ERI  History of Present Illness: Lauren Norton is a 76 y.o. female with history of HTN, HLD, DM, advanced heart block w/PPM  I saw her 02/19/2020 Upon entering the room she tells me her BP and HR are always high at the doctor and she gets very, very nervous and anxious.  She has been staying home and being out and about is also very anxiety provoking She is feeling well.  Reports walking nearly every day with excellent exertional capacity, no CP or palpitations, no cardiac awareness.  No dizzy spells near syncope or syncope. About a year ago she started noticing with some weight loss and her walking her BP started to lower, and noted on occasion as low as SBPs 89-90's and reduced her lisinopril to 2.59m from 5 and her BP at home are very good, and rarely >120  She has labs done with her PMD No changes were made.  She last saw EP service with AJonni Sanger9/8/22, mild SOB with hills in the heat.  He observed her to have occasional A lead noise, minimally reproducible at that visit with hand rubbing and isometrics noted for a couple years. Battery was 142moo ERI  TODAY She is accompanied by her husband They had been on a cruise late July, had a great time, unfortunately all of them in their group got quite sick with flu and pneumonias as well. She is feeling well again No concerns outside her device battery. No CP, palpitations No SOB No near syncope or syncope.   Device information SJM dual chamber PPM implanted 02/27/2012   Past Medical History:  Diagnosis Date   Anxiety    Bronchitis 2018   Cataract 2017   both eyes   Diabetes [place]   HLD (hyperlipidemia)    HTN (hypertension)    Osteoarthritis of both hips    both hips replaced   Pneumonia    many years ago   Presence of  permanent cardiac pacemaker    2013   Second degree AV block    a.s/p MDT dual chamber PPM    Shingles    Skin cancer ~ 2007   "back"    Past Surgical History:  Procedure Laterality Date   ANTERIOR AND POSTERIOR REPAIR N/A 01/29/2018   Procedure: ANTERIOR (CYSTOCELE) AND POSTERIOR REPAIR (RECTOCELE);  Surgeon: RiPaula ComptonMD;  [facility];  Service: Gynecology;  Laterality: N/A;   CATARACT EXTRACTION, BILATERAL  2017   CYST REMOVAL NECK  2016   CYSTOSCOPY N/A 01/29/2018   Procedure: CYSTOSCOPY;  Surgeon: RiPaula ComptonMD;  [facility];  Service: Gynecology;  Laterality: N/A;   JOINT REPLACEMENT Bilateral    bilateral hip replacements   left elbow fracture     age 76 PERMANENT PACEMAKER INSERTION N/A 02/27/2012   a. MDT dual chamber PPM implanted by Dr AlRayann Hemanor Mobitz II   SKIN CANCER EXCISION  ~ 2007   TONSILLECTOMY AND ADENOIDECTOMY  ~ [place]; 2012   right; left   VAGINAL HYSTERECTOMY N/A 01/29/2018   Procedure: HYSTERECTOMY VAGINAL;  Surgeon: RiPaula ComptonMD;  [facility];  Service: Gynecology;  Laterality: N/A;    Current Outpatient Medications  Medication Sig Dispense Refill   acetaminophen (TYLENOL) 325 MG tablet  Take 2 tablets (650 mg total) by mouth every 6 (six) hours as needed for mild pain. 30 tablet 0   alendronate (FOSAMAX) 70 MG tablet Take 70 mg by mouth once a week. Tuesdays     atorvastatin (LIPITOR) 10 MG tablet Take 10 mg by mouth daily with supper.      Biotin 5000 MCG TABS Take 1 tablet by mouth daily.     calcium citrate-vitamin D (CITRACAL+D) 315-200 MG-UNIT tablet Take 1 tablet by mouth 2 (two) times daily.     cetirizine (ZYRTEC) 10 MG tablet Take 5 mg by mouth at bedtime as needed for allergies.      CINNAMON PO Take 650 mg by mouth daily.      CRANBERRY PO Take 650 mg by mouth 2 (two) times daily.      ibuprofen (ADVIL,MOTRIN) 600 MG tablet Take 1 tablet (600 mg total) by mouth  every 6 (six) hours as needed (mild pain). 30 tablet 0   lisinopril (ZESTRIL) 2.5 MG tablet Take 0.5 tablets by mouth daily at 6 (six) AM.     metFORMIN (GLUCOPHAGE) 500 MG tablet Take 500-1,000 mg by mouth See admin instructions. Take 549m by mouth in the morning, 5033mat lunch and 100038mt supper time     polyethylene glycol powder (GLYCOLAX/MIRALAX) powder Take 17 g by mouth every other day.     Pumpkin Seed-Soy Germ (AZO BLADDER CONTROL/GO-LESS) CAPS Take 2 tablets by mouth daily.     sodium chloride (MURO 128) 2 % ophthalmic solution Place 1 drop into both eyes as needed for eye irritation.     tretinoin (RETIN-A) 0.05 % cream Apply topically at bedtime.     No current facility-administered medications for this visit.    Allergies:   Sulfa antibiotics   Social History:  The patient  reports that she has never smoked. She has never used smokeless tobacco. She reports current alcohol use of about 6.0 standard drinks of alcohol per week. She reports that she does not use drugs.   Family History:  The patient's family history includes Alzheimer's disease in her mother; CAD in her father; Diabetes in her father; Heart attack in her father; Heart disease in her maternal grandmother; Hypertension in her father; Prostate cancer in her father.  ROS:  Please see the history of present illness. All other systems are reviewed and otherwise negative.   PHYSICAL EXAM:  VS:  There were no vitals taken for this visit. BMI: There is no height or weight on file to calculate BMI. Well nourished, well developed, in no acute distress  HEENT: normocephalic, atraumatic  Neck: no JVD, carotid bruits or masses Cardiac:  RRR; no significant murmurs, no rubs, or gallops Lungs:  CTA b/l, no wheezing, rhonchi or rales  Abd: soft, nontender MS: no deformity or atrophy Ext:  no edema  Skin: warm and dry, no rash Neuro:  No gross deficits appreciated Psych: euthymic mood, full affect  PPM site is stable, no  tethering or discomfort   EKG:  Done today and reviewed by myself shows  ST, V paced 103bpm  PPM interrogation done today and reviewed by myself: Battery reached ERI 02/08/22 Lead measurements are good She has A lead noise, reproducible with arm movement. Numerous AMS episodes  All available EGMs are noise some of these trigger AMS and she is in SR/ST and VP at AMS rates No true AFib by available EGMs  Recent Labs: No results found for requested labs within last 365 days.  No results found for requested labs within last 365 days.   CrCl cannot be calculated (Patient's most recent lab result is older than the maximum 21 days allowed.).   Wt Readings from Last 3 Encounters:  02/08/22 145 lb 3.2 oz (65.9 kg)  02/17/21 143 lb 3.2 oz (65 kg)  02/19/20 142 lb (64.4 kg)     Other studies reviewed: Additional studies/records reviewed today include: summarized above  ASSESSMENT AND PLAN:  1. PPM     ERI     She is V dependent     A lead noise, impedence and threshold are stable     AP 7.8%, indication was heart block  Discussed A lead noise, unlikely that we will proceed with new A lead given minimal A pacing Discussed gen change procedure, potential risks, benefits She met Dr. Curt Bears today Will plan for an echo pre-procedure Gen change and usual follow up  Her HR did settle to the 90's, she states her HR always gets elevated at the doctor's office   ADDEND 03/30/22 Echo with LVEF 45-50%, in review with Dr. Curt Bears, will plan to pursue upgrade to CRT-P. I spoke with the patient today, rational for upgrade, the procedure, potential risks/benefits.  She is agreeable and appreciated of the proactive management. I have changed her procedure with cath lab today as well. Tommye Standard, PA-C   2. HTN     Looks good   3. ST vs AT Suspect is ST by HR behavior today    Disposition: usual post gen change follow up   Current medicines are reviewed at length with the patient  today.  The patient did not have any concerns regarding medicines.  Venetia Night, PA-C 02/16/2022 6:17 PM     [address] [place] [address] [phone] (office)  [phone] (fax)

## 2022-02-17 ENCOUNTER — Ambulatory Visit: Payer: Medicare Other | Attending: Physician Assistant | Admitting: Physician Assistant

## 2022-02-17 ENCOUNTER — Encounter: Payer: Self-pay | Admitting: Physician Assistant

## 2022-02-17 ENCOUNTER — Encounter: Payer: Self-pay | Admitting: *Deleted

## 2022-02-17 VITALS — BP 124/70 | HR 103 | Ht 66.0 in | Wt 143.0 lb

## 2022-02-17 DIAGNOSIS — Z45010 Encounter for checking and testing of cardiac pacemaker pulse generator [battery]: Secondary | ICD-10-CM | POA: Diagnosis not present

## 2022-02-17 DIAGNOSIS — I10 Essential (primary) hypertension: Secondary | ICD-10-CM

## 2022-02-17 DIAGNOSIS — Z01818 Encounter for other preprocedural examination: Secondary | ICD-10-CM

## 2022-02-17 DIAGNOSIS — Z950 Presence of cardiac pacemaker: Secondary | ICD-10-CM | POA: Diagnosis not present

## 2022-02-17 LAB — CUP PACEART INCLINIC DEVICE CHECK
Battery Remaining Longevity: 0 mo
Battery Voltage: 2.57 V
Brady Statistic RA Percent Paced: 7.8 %
Brady Statistic RV Percent Paced: 99.93 %
Date Time Interrogation Session: 20230908184553
Implantable Lead Implant Date: 20130917
Implantable Lead Implant Date: 20130917
Implantable Lead Location: 753859
Implantable Lead Location: 753860
Implantable Lead Model: 1944
Implantable Lead Model: 1948
Implantable Pulse Generator Implant Date: 20130917
Lead Channel Impedance Value: 650 Ohm
Lead Channel Impedance Value: 662.5 Ohm
Lead Channel Pacing Threshold Amplitude: 0.5 V
Lead Channel Pacing Threshold Amplitude: 0.5 V
Lead Channel Pacing Threshold Amplitude: 0.5 V
Lead Channel Pacing Threshold Amplitude: 0.5 V
Lead Channel Pacing Threshold Pulse Width: 0.5 ms
Lead Channel Pacing Threshold Pulse Width: 0.5 ms
Lead Channel Pacing Threshold Pulse Width: 0.5 ms
Lead Channel Pacing Threshold Pulse Width: 0.5 ms
Lead Channel Sensing Intrinsic Amplitude: 2.8 mV
Lead Channel Sensing Intrinsic Amplitude: 9.6 mV
Lead Channel Setting Pacing Amplitude: 0.875
Lead Channel Setting Pacing Amplitude: 2 V
Lead Channel Setting Pacing Pulse Width: 0.5 ms
Lead Channel Setting Sensing Sensitivity: 2 mV
Pulse Gen Model: 2210
Pulse Gen Serial Number: 7387861

## 2022-02-17 NOTE — Patient Instructions (Addendum)
Medication Instructions:   Your physician recommends that you continue on your current medications as directed. Please refer to the Current Medication list given to you today.   *If you need a refill on your cardiac medications before your next appointment, please call your pharmacy*   Lab Work:  BMET AND CBC TODAY    If you have labs (blood work) drawn today and your tests are completely normal, you will receive your results only by: [place] (if you have MyChart) OR A paper copy in the mail If you have any lab test that is abnormal or we need to change your treatment, we will call you to review the results.   Testing/Procedures:  SEE LETTER FOR DEVICE CHANGE  ON  04-04-22     Follow-Up: At [facility], you and your health needs are our priority.  As part of our continuing mission to provide you with exceptional heart care, we have created designated Provider Care Teams.  These Care Teams include your primary Cardiologist (physician) and Advanced Practice Providers (APPs -  Physician Assistants and Nurse Practitioners) who all work together to provide you with the care you need, when you need it.  We recommend signing up for the patient portal called "MyChart".  Sign up information is provided on this After Visit Summary.  MyChart is used to connect with patients for Virtual Visits (Telemedicine).  Patients are able to view lab/test results, encounter notes, upcoming appointments, etc.  Non-urgent messages can be sent to your provider as well.   To learn more about what you can do with MyChart, go to [email or web address].    Your next appointment:  10-14 days  AFTER  04-04-22  [place]   3 month(s)  AFTER 04-04-22   The format for your next appointment:   In Person  Provider:   Allegra Lai, MD  [place]   Other Instructions  Pacemaker Battery Change  A pacemaker battery usually lasts 5-15 years (6-7 years on average). A few times  a year, you may be asked to visit your health care provider to have a full evaluation of your pacemaker. When the battery is low, your pacemaker will be completely replaced. Most often, this procedure is simpler than the first surgery because the wires (leads) that connect the pacemaker to the heart are already in place. There are many things that affect how long a pacemaker battery will last, including: The age of the pacemaker. The number of leads you have(1, 2, or 3). The use or workload of the pacemaker. If the pacemaker is helping the heart more often, the battery will not last as long. Power (voltage) settings. Tell a health care provider about: Any allergies you have. All medicines you are taking, including vitamins, herbs, eye drops, creams, and over-the-counter medicines. Any problems you or family members have had with anesthetic medicines. Any blood disorders you have. Any surgeries you have had, especially any surgeries you have had since your last pacemaker was placed. Any medical conditions you have. Whether you are pregnant or may be pregnant. What are the risks? Generally, this is a safe procedure. However, problems may occur, including: Bleeding. Infection. Nerve damage. Allergic reaction to medicines. Damage to the leads that go to the heart. What happens before the procedure? Staying hydrated Follow instructions from your health care provider about hydration, which may include: Up to 2 hours before the procedure - you may continue to drink clear liquids, such as water, clear  fruit juice, black coffee, and plain tea. Eating and drinking restrictions Follow instructions from your health care provider about eating and drinking restrictions, which may include: 8 hours before the procedure - stop eating heavy meals or foods, such as meat, fried foods, or fatty foods. 6 hours before the procedure - stop eating light meals or foods, such as toast or cereal. 6 hours before the  procedure - stop drinking milk or drinks that contain milk. 2 hours before the procedure - stop drinking clear liquids. Medicines Ask your health care provider about: Changing or stopping your regular medicines. This is especially important if you are taking diabetes medicines or blood thinners. Taking medicines such as aspirin and ibuprofen. These medicines can thin your blood. Do not take these medicines unless your health care provider tells you to take them. Taking over-the-counter medicines, vitamins, herbs, and supplements. General instructions Ask your health care provider what steps will be taken to help prevent infection. These may include: Removing hair at the surgery site. Washing skin with a germ-killing soap. Receiving antibiotic medicine. Plan to have someone take you home from the hospital or clinic. If you will be going home right after the procedure, plan to have someone with you for 24 hours. What happens during the procedure? An IV will be inserted into one of your veins. You will be given one or more of the following: A medicine to help you relax (sedative). A medicine to numb the area where the pacemaker is located (local anesthetic). Your health care provider will make an incision to reopen the pocket holding the pacemaker. The old pacemaker will be disconnected from the leads. The leads will be tested. If needed, the leads will be replaced. If the leads are functioning properly, the new pacemaker will be connected to the existing leads. A heart monitor and a pacemaker programmer will be used to make sure that the newly implanted pacemaker is working properly. The incision site will be closed with stitches (sutures), adhesive strips, or skin glue. A bandage (dressing) will be placed over the pacemaker site. The procedure may vary among health care providers and hospitals. What happens after the procedure? Your blood pressure, heart rate, breathing rate, and blood  oxygen level will be monitored until you leave the hospital or clinic. You may be given antibiotics. Your health care provider will tell you when your pacemaker will need to be tested again, or when to return to the office for removal of the dressing and sutures. If you were given a sedative during the procedure, it can affect you for several hours. Do not drive or operate machinery until your health care provider says that it is safe. You will be given a pacemaker identification card. This card lists the implant date, device model, and manufacturer of your pacemaker. Summary A pacemaker battery usually lasts 5-15 years (6-7 years on average). When the battery is low, your pacemaker will need to be replaced. Most often, this procedure is simpler than the first surgery because the wires (leads) that connect the pacemaker to the heart are already in place. Risks of this procedure include bleeding, infection, and allergic reactions to medicines. This information is not intended to replace advice given to you by your health care provider. Make sure you discuss any questions you have with your health care provider. Document Revised: 05/01/2019 Document Reviewed: 05/01/2019 Elsevier Patient Education  [place]

## 2022-02-21 NOTE — Addendum Note (Signed)
Addended by: Drue Novel I on: 02/21/2022 09:30 AM   Modules accepted: Orders

## 2022-02-27 NOTE — Progress Notes (Signed)
Remote pacemaker transmission.   

## 2022-02-27 NOTE — Addendum Note (Signed)
Addended by: Douglass Rivers D on: 02/27/2022 03:22 PM   Modules accepted: Level of Service

## 2022-03-06 ENCOUNTER — Ambulatory Visit (INDEPENDENT_AMBULATORY_CARE_PROVIDER_SITE_OTHER): Payer: Medicare Other

## 2022-03-06 DIAGNOSIS — I442 Atrioventricular block, complete: Secondary | ICD-10-CM

## 2022-03-07 LAB — CUP PACEART REMOTE DEVICE CHECK
Battery Remaining Longevity: 0 mo
Battery Voltage: 2.57 V
Brady Statistic AP VP Percent: 7.5 %
Brady Statistic AP VS Percent: 1 %
Brady Statistic AS VP Percent: 92 %
Brady Statistic AS VS Percent: 1 %
Brady Statistic RA Percent Paced: 7.4 %
Brady Statistic RV Percent Paced: 99 %
Date Time Interrogation Session: 20230926114116
Implantable Lead Implant Date: 20130917
Implantable Lead Implant Date: 20130917
Implantable Lead Location: 753859
Implantable Lead Location: 753860
Implantable Lead Model: 1944
Implantable Lead Model: 1948
Implantable Pulse Generator Implant Date: 20130917
Lead Channel Impedance Value: 540 Ohm
Lead Channel Impedance Value: 630 Ohm
Lead Channel Pacing Threshold Amplitude: 0.5 V
Lead Channel Pacing Threshold Amplitude: 0.875 V
Lead Channel Pacing Threshold Pulse Width: 0.5 ms
Lead Channel Pacing Threshold Pulse Width: 0.5 ms
Lead Channel Sensing Intrinsic Amplitude: 2 mV
Lead Channel Sensing Intrinsic Amplitude: 9.6 mV
Lead Channel Setting Pacing Amplitude: 1.125
Lead Channel Setting Pacing Amplitude: 2 V
Lead Channel Setting Pacing Pulse Width: 0.5 ms
Lead Channel Setting Sensing Sensitivity: 2 mV
Pulse Gen Model: 2210
Pulse Gen Serial Number: 7387861

## 2022-03-14 ENCOUNTER — Ambulatory Visit: Payer: Medicare Other

## 2022-03-14 ENCOUNTER — Ambulatory Visit (HOSPITAL_COMMUNITY): Payer: Medicare Other | Attending: Physician Assistant

## 2022-03-14 DIAGNOSIS — E119 Type 2 diabetes mellitus without complications: Secondary | ICD-10-CM | POA: Diagnosis not present

## 2022-03-14 DIAGNOSIS — I10 Essential (primary) hypertension: Secondary | ICD-10-CM | POA: Diagnosis not present

## 2022-03-14 DIAGNOSIS — Z0189 Encounter for other specified special examinations: Secondary | ICD-10-CM | POA: Diagnosis not present

## 2022-03-14 DIAGNOSIS — Z950 Presence of cardiac pacemaker: Secondary | ICD-10-CM | POA: Insufficient documentation

## 2022-03-14 DIAGNOSIS — E785 Hyperlipidemia, unspecified: Secondary | ICD-10-CM | POA: Insufficient documentation

## 2022-03-14 DIAGNOSIS — Z01818 Encounter for other preprocedural examination: Secondary | ICD-10-CM | POA: Diagnosis present

## 2022-03-14 LAB — ECHOCARDIOGRAM COMPLETE
Area-P 1/2: 4.68 cm2
S' Lateral: 3.4 cm

## 2022-03-15 ENCOUNTER — Other Ambulatory Visit: Payer: Self-pay | Admitting: *Deleted

## 2022-03-15 LAB — CBC
Hematocrit: 42.6 % (ref 34.0–46.6)
Hemoglobin: 13.7 g/dL (ref 11.1–15.9)
MCH: 28.4 pg (ref 26.6–33.0)
MCHC: 32.2 g/dL (ref 31.5–35.7)
MCV: 88 fL (ref 79–97)
Platelets: 275 10*3/uL (ref 150–450)
RBC: 4.83 x10E6/uL (ref 3.77–5.28)
RDW: 14.1 % (ref 11.7–15.4)
WBC: 6 10*3/uL (ref 3.4–10.8)

## 2022-03-15 LAB — BASIC METABOLIC PANEL
BUN/Creatinine Ratio: 28 (ref 12–28)
BUN: 26 mg/dL (ref 8–27)
CO2: 24 mmol/L (ref 20–29)
Calcium: 10.9 mg/dL — ABNORMAL HIGH (ref 8.7–10.3)
Chloride: 95 mmol/L — ABNORMAL LOW (ref 96–106)
Creatinine, Ser: 0.93 mg/dL (ref 0.57–1.00)
Glucose: 118 mg/dL — ABNORMAL HIGH (ref 70–99)
Potassium: 4.4 mmol/L (ref 3.5–5.2)
Sodium: 137 mmol/L (ref 134–144)
eGFR: 64 mL/min/{1.73_m2} (ref >59)

## 2022-03-15 MED ORDER — METOPROLOL SUCCINATE ER 25 MG PO TB24: 25.0000 mg | ORAL_TABLET | Freq: Every day | ORAL | 1 refills | Status: DC

## 2022-03-17 ENCOUNTER — Ambulatory Visit: Payer: Medicare Other | Admitting: Gastroenterology

## 2022-03-17 ENCOUNTER — Encounter: Payer: Self-pay | Admitting: Gastroenterology

## 2022-03-17 VITALS — BP 148/76 | HR 87 | Ht 66.0 in | Wt 144.0 lb

## 2022-03-17 DIAGNOSIS — K641 Second degree hemorrhoids: Secondary | ICD-10-CM | POA: Diagnosis not present

## 2022-03-17 NOTE — Patient Instructions (Addendum)
_______________________________________________________ Dennis Bast have been scheduled for an appointment with Dr. Bryan Lemma on 04/25/22 at 940 am . Please arrive 10 minutes early for your appointment.  I   f you are age 76 or older, your body mass index should be between 23-30. Your Body mass index is 23.24 kg/m. If this is out of the aforementioned range listed, please consider follow up with your Primary Care Provider.  If you are age 1 or younger, your body mass index should be between 19-25. Your Body mass index is 23.24 kg/m. If this is out of the aformentioned range listed, please consider follow up with your Primary Care Provider.   ________________________________________________________  The [facility] providers would like to encourage you to use [facility] to communicate with providers for non-urgent requests or questions.  Due to long hold times on the telephone, sending your provider a message by [facility] may be a faster and more efficient way to get a response.  Please allow 48 business hours for a response.  Please remember that this is for non-urgent requests.  _______________________________________________________     [facility] Jew PROCEDURE    FOLLOW-UP CARE   The procedure you have had should have been relatively painless since the banding of the area involved does not have nerve endings and there is no pain sensation.  The rubber band cuts off the blood supply to the hemorrhoid and the band may fall off as soon as 48 hours after the banding (the band may occasionally be seen in the toilet bowl following a bowel movement). You may notice a temporary feeling of fullness in the rectum which should respond adequately to plain Tylenol or Motrin.  Following the banding, avoid strenuous exercise that evening and resume full activity the next day.  A sitz bath (soaking in a warm tub) or bidet is soothing, and can be useful for cleansing the area after bowel movements.     To  avoid constipation, take two tablespoons of natural wheat bran, natural oat bran, flax, Benefiber or any over the counter fiber supplement and increase your water intake to 7-8 glasses daily.    Unless you have been prescribed anorectal medication, do not put anything inside your rectum for two weeks: No suppositories, enemas, fingers, etc.  Occasionally, you may have more bleeding than usual after the banding procedure.  This is often from the untreated hemorrhoids rather than the treated one.  Don't be concerned if there is a tablespoon or so of blood.  If there is more blood than this, lie flat with your bottom higher than your head and apply an ice pack to the area. If the bleeding does not stop within a half an hour or if you feel faint, call our office at [phone] or go to the emergency room.  Problems are not common; however, if there is a substantial amount of bleeding, severe pain, chills, fever or difficulty passing urine (very rare) or other problems, you should call us at [phone] or report to the nearest emergency room.  Do not stay seated continuously for more than 2-3 hours for a day or two after the procedure.  Tighten your buttock muscles 10-15 times every two hours and take 10-15 deep breaths every 1-2 hours.  Do not spend more than a few minutes on the toilet if you cannot empty your bowel; instead re-visit the toilet at a later time.

## 2022-03-17 NOTE — Progress Notes (Signed)
Chief Complaint:    Symptomatic Internal Hemorrhoids; Hemorrhoid Band Ligation  GI History: Lauren Norton is a 76 y.o. 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     History of chronic constipation, which is generally well controlled with high-fiber diet and MiraLAX.  Uses Miralax prn. Constipation since childhood.    History of hemorrhoids, which she has historically treated with topical hydrocortisone prn. More relief with A+D ointment.  Has had hemorrhoidal sxs for 10+ years. Worse with exacerbation of constipation. Index sxs of itching, irritation, seepage/incomplete cleaning ; rare scant BRBPR.   Was seen in 2016 for hemorrhoidal sxs and constipation. Colonoscopy at that time with a single benign polyp and internal hemorrhoids.     Endoscopic History: - Colonoscopy in [place] in the early 2000's which was reportedly normal/no polyps - Colonoscopy (10/30/2014): 3 mm descending colon polyp (path: Benign lymphoid aggregate), Internal hemorrhoids.  Repeat in 10 years  HPI:     Patient is a 76 y.o. femalewith a history of symptomatic internal hemorrhoids presenting to the Gastroenterology Clinic for follow-up and ongoing treatment. The patient presents with symptomatic grade 2 hemorrhoids, unresponsive to maximal medical therapy, requesting rubber band ligation of symptomatic hemorrhoidal disease.  Was initially seen by me on 02/08/2022.  Exam that day with grade 2 internal hemorrhoids in all positions.  She elected to postpone hemorrhoid banding as she is traveling to [place].  She presents today to pick up on hemorrhoid banding.  No change in medical or surgical history, medications, allergies, social history since last appointment with me.   Review of systems:     No chest pain, no SOB, no fevers, no urinary sx   Past Medical History:  Diagnosis  Date   Anxiety    Bronchitis 2018   Cataract 2017   both eyes   Diabetes ([place])    HLD (hyperlipidemia)    HTN (hypertension)    Osteoarthritis of both hips    both hips replaced   Pneumonia    many years ago   Presence of permanent cardiac pacemaker    2013   Second degree AV block    a.s/p MDT dual chamber PPM    Shingles    Skin cancer ~ 2007   "back"    Patient's surgical history, family medical history, social history, medications and allergies were all reviewed in Epic    Current Outpatient Medications  Medication Sig Dispense Refill   acetaminophen (TYLENOL) 325 MG tablet Take 2 tablets (650 mg total) by mouth every 6 (six) hours as needed for mild pain. 30 tablet 0   alendronate (FOSAMAX) 70 MG tablet Take 70 mg by mouth once a week. Tuesdays     atorvastatin (LIPITOR) 10 MG tablet Take 10 mg by mouth daily with supper.      Biotin 5000 MCG TABS Take 1 tablet by mouth daily.     calcium citrate-vitamin D (CITRACAL+D) 315-200 MG-UNIT tablet Take 1 tablet by mouth 2 (two) times daily.     cetirizine (ZYRTEC) 10 MG tablet Take 5 mg by mouth at bedtime as needed for allergies.      CRANBERRY PO Take 650 mg by mouth 2 (two) times daily.      ibuprofen (ADVIL,MOTRIN) 600 MG tablet Take 1 tablet (600 mg total) by mouth every 6 (six) hours as needed (mild pain). 30 tablet 0   lisinopril (  ZESTRIL) 2.5 MG tablet Take 0.5 tablets by mouth daily at 6 (six) AM.     metFORMIN (GLUCOPHAGE) 500 MG tablet Take 500-1,000 mg by mouth See admin instructions. Take '500mg'$  by mouth in the morning, '500mg'$  at lunch and '1000mg'$  at supper time     metoprolol succinate (TOPROL-XL) 25 MG 24 hr tablet Take 1 tablet (25 mg total) by mouth daily. 90 tablet 1   polyethylene glycol powder (GLYCOLAX/MIRALAX) powder Take 17 g by mouth every other day.     Pumpkin Seed-Soy Germ (AZO BLADDER CONTROL/GO-LESS) CAPS Take 2 tablets by mouth daily.     sodium chloride (MURO 128) 2 % ophthalmic solution Place 1 drop  into both eyes as needed for eye irritation.     tretinoin (RETIN-A) 0.05 % cream Apply topically at bedtime.     No current facility-administered medications for this visit.    Physical Exam:     BP (!) 148/76   Pulse 87   Ht '5\' 6"'$  (1.676 m)   Wt 144 lb (65.3 kg)   BMI 23.24 kg/m   GENERAL:  Pleasant female in NAD PSYCH: : Cooperative, normal affect NEURO: Alert and oriented x 3, no focal neurologic deficits Rectal exam: Sensation intact and preserved anal wink.  Grade 2 hemorrhoids noted in all positions on exam.  No external anal fissures noted. Normal sphincter tone. No palpable mass. No blood on the exam glove. (Chaperone: [employer], CMA.  Patient's husband also present in room by patient request/permission).   IMPRESSION and PLAN:    #1.  Symptomatic internal hemorrhoids: PROCEDURE NOTE: The patient presents with symptomatic grade 2 hemorrhoids, unresponsive to maximal medical therapy, requesting rubber band ligation of symptomatic hemorrhoidal disease.  All risks, benefits and alternative forms of therapy were described and informed consent was obtained.  In the Left Lateral Decubitus position, examination revealed grade 2 hemorrhoids in the all position(s).  The anorectum was pre-medicated with RectiCare. The decision was made to band the RP internal hemorrhoid, and the [place] was used to perform band ligation without complication.  Digital anorectal examination was then performed to assure proper positioning of the band, and to adjust the banded tissue as required.  The patient was discharged home without pain or other issues.  Dietary and behavioral recommendations were given and along with follow-up instructions.     The following adjunctive treatments were recommended:  -Resume high-fiber diet with fiber supplement (i.e. Citrucel or Benefiber) with goal for soft stools without straining to have a BM. -Resume adequate fluid intake.  The patient will  return in 4 for follow-up and possible additional banding as required. No complications were encountered and the patient tolerated the procedure well.   #2.  Chronic constipation -Continue high-fiber diet with MiraLAX prn - Continue adequate hydration      Lavena Bullion ,DO, FACG 03/17/2022, 10:55 AM

## 2022-03-23 NOTE — Addendum Note (Signed)
Addended by: Douglass Rivers D on: 03/23/2022 11:49 AM   Modules accepted: Level of Service

## 2022-03-23 NOTE — Progress Notes (Signed)
Remote pacemaker transmission.   

## 2022-04-03 ENCOUNTER — Telehealth: Payer: Self-pay | Admitting: Cardiology

## 2022-04-03 NOTE — Telephone Encounter (Signed)
Pt reports that hospital called and gave her instructions but that she is glad I called to confirm everything. Aware NPO after MN night before procedure. Pt reports that she was told she could take her Lisinopril & Metoprolol the morning of procedure with sip of water -- aware this is ok. Pt appreciates my follow up call.

## 2022-04-03 NOTE — Telephone Encounter (Signed)
New Message:     Patient having a pacemaker put in tomorrow. She wants to know what is the eating restrictions  are please.?

## 2022-04-03 NOTE — Pre-Procedure Instructions (Signed)
Instructed patient on the following items: Arrival time 1400 Nothing to eat or drink after midnight No meds AM of procedure Responsible person to drive you home and stay with you for 24 hrs Wash with special soap night before and morning of procedure  

## 2022-04-04 ENCOUNTER — Ambulatory Visit (HOSPITAL_COMMUNITY)
Admission: RE | Disposition: A | Discharge status: Home / Self Care | Payer: Medicare Other | Source: Home / Self Care | Attending: Cardiology

## 2022-04-04 ENCOUNTER — Ambulatory Visit (HOSPITAL_COMMUNITY)
Admission: RE | Admit: 2022-04-04 | Discharge: 2022-04-04 | Disposition: A | Discharge status: Home / Self Care | Payer: Medicare Other | Attending: Cardiology | Admitting: Cardiology

## 2022-04-04 ENCOUNTER — Ambulatory Visit (HOSPITAL_COMMUNITY): Payer: Medicare Other

## 2022-04-04 DIAGNOSIS — I110 Hypertensive heart disease with heart failure: Secondary | ICD-10-CM | POA: Diagnosis not present

## 2022-04-04 DIAGNOSIS — I5022 Chronic systolic (congestive) heart failure: Secondary | ICD-10-CM | POA: Diagnosis not present

## 2022-04-04 DIAGNOSIS — Z45010 Encounter for checking and testing of cardiac pacemaker pulse generator [battery]: Secondary | ICD-10-CM | POA: Insufficient documentation

## 2022-04-04 DIAGNOSIS — I441 Atrioventricular block, second degree: Secondary | ICD-10-CM | POA: Diagnosis not present

## 2022-04-04 DIAGNOSIS — Z79899 Other long term (current) drug therapy: Secondary | ICD-10-CM | POA: Insufficient documentation

## 2022-04-04 DIAGNOSIS — R001 Bradycardia, unspecified: Secondary | ICD-10-CM | POA: Insufficient documentation

## 2022-04-04 DIAGNOSIS — E119 Type 2 diabetes mellitus without complications: Secondary | ICD-10-CM | POA: Insufficient documentation

## 2022-04-04 DIAGNOSIS — E785 Hyperlipidemia, unspecified: Secondary | ICD-10-CM | POA: Insufficient documentation

## 2022-04-04 HISTORY — PX: BIV UPGRADE: EP1202

## 2022-04-04 LAB — GLUCOSE, CAPILLARY
Glucose-Capillary: 143 mg/dL — ABNORMAL HIGH (ref 70–99)
Glucose-Capillary: 115 mg/dL — ABNORMAL HIGH (ref 70–99)

## 2022-04-04 SURGERY — BIV UPGRADE
Anesthesia: LOCAL

## 2022-04-04 MED ORDER — SODIUM CHLORIDE 0.9 % IV SOLN
INTRAVENOUS | Status: AC
  Filled 2022-04-04: qty 2

## 2022-04-04 MED ORDER — CEFAZOLIN SODIUM-DEXTROSE 2-4 GM/100ML-% IV SOLN
INTRAVENOUS | Status: AC
  Filled 2022-04-04: qty 100

## 2022-04-04 MED ORDER — HEPARIN (PORCINE) IN NACL 1000-0.9 UT/500ML-% IV SOLN
INTRAVENOUS | Status: DC | PRN
  Administered 2022-04-04: 500 mL

## 2022-04-04 MED ORDER — POVIDONE-IODINE 10 % EX SWAB
2.0000 | Freq: Once | CUTANEOUS | Status: AC
  Administered 2022-04-04: 2 via TOPICAL

## 2022-04-04 MED ORDER — SODIUM CHLORIDE 0.9 % IV SOLN
80.0000 mg | INTRAVENOUS | Status: AC
  Administered 2022-04-04: 80 mg

## 2022-04-04 MED ORDER — SODIUM CHLORIDE 0.9 % IV SOLN: INTRAVENOUS | Status: DC

## 2022-04-04 MED ORDER — MIDAZOLAM HCL 5 MG/5ML IJ SOLN
INTRAMUSCULAR | Status: DC | PRN
  Administered 2022-04-04 (×2): 1 mg via INTRAVENOUS

## 2022-04-04 MED ORDER — FENTANYL CITRATE (PF) 100 MCG/2ML IJ SOLN
INTRAMUSCULAR | Status: AC
  Filled 2022-04-04: qty 2

## 2022-04-04 MED ORDER — IODIXANOL 320 MG/ML IV SOLN
INTRAVENOUS | Status: DC | PRN
  Administered 2022-04-04: 15 mL

## 2022-04-04 MED ORDER — ONDANSETRON HCL 4 MG/2ML IJ SOLN: 4.0000 mg | Freq: Four times a day (QID) | INTRAMUSCULAR | Status: DC | PRN

## 2022-04-04 MED ORDER — CEFAZOLIN SODIUM-DEXTROSE 2-4 GM/100ML-% IV SOLN
2.0000 g | INTRAVENOUS | Status: AC
  Administered 2022-04-04: 2 g via INTRAVENOUS

## 2022-04-04 MED ORDER — CEFAZOLIN SODIUM-DEXTROSE 1-4 GM/50ML-% IV SOLN
1.0000 g | Freq: Four times a day (QID) | INTRAVENOUS | Status: DC
  Administered 2022-04-04: 1 g via INTRAVENOUS
  Filled 2022-04-04 (×2): qty 50

## 2022-04-04 MED ORDER — LIDOCAINE HCL (PF) 1 % IJ SOLN
INTRAMUSCULAR | Status: AC
  Filled 2022-04-04: qty 60

## 2022-04-04 MED ORDER — CHLORHEXIDINE GLUCONATE 4 % EX LIQD: 4.0000 | Freq: Once | CUTANEOUS | Status: DC

## 2022-04-04 MED ORDER — FENTANYL CITRATE (PF) 100 MCG/2ML IJ SOLN
INTRAMUSCULAR | Status: DC | PRN
  Administered 2022-04-04 (×2): 25 ug via INTRAVENOUS

## 2022-04-04 MED ORDER — MIDAZOLAM HCL 5 MG/5ML IJ SOLN
INTRAMUSCULAR | Status: AC
  Filled 2022-04-04: qty 5

## 2022-04-04 MED ORDER — ACETAMINOPHEN 325 MG PO TABS: 325.0000 mg | ORAL_TABLET | ORAL | Status: DC | PRN

## 2022-04-04 MED ORDER — LIDOCAINE HCL (PF) 1 % IJ SOLN
INTRAMUSCULAR | Status: DC | PRN
  Administered 2022-04-04: 60 mL

## 2022-04-04 SURGICAL SUPPLY — 18 items
BALLN COR SINUS VENO 6FR 80 (BALLOONS) ×1
BALLOON COR SINUS VENO 6FR 80 (BALLOONS) IMPLANT
CABLE SURGICAL S-101-97-12 (CABLE) ×1 IMPLANT
CATH CPS DIRECT 135 DS2C020 (CATHETERS) IMPLANT
CATH CPS QUART CN DS2N029-65 (CATHETERS) IMPLANT
GUIDEWIRE ANGLED .035X150CM (WIRE) IMPLANT
KIT MICROPUNCTURE NIT STIFF (SHEATH) IMPLANT
LEAD QUARTET 1458Q-86CM (Lead) IMPLANT
PACEMAKER QUDR ALLR CRT PM3562 (Pacemaker) IMPLANT
PAD DEFIB RADIO PHYSIO CONN (PAD) ×1 IMPLANT
PMKR QUADRA ALLURE CRT PM3562 (Pacemaker) ×1 IMPLANT
POUCH AIGIS-R ANTIBACT PPM (Mesh General) ×1 IMPLANT
POUCH AIGIS-R ANTIBACT PPM MED (Mesh General) IMPLANT
SHEATH PROBE COVER 6X72 (BAG) IMPLANT
SLITTER AGILIS HISPRO (INSTRUMENTS) IMPLANT
TRAY PACEMAKER INSERTION (PACKS) ×1 IMPLANT
WIRE ACUITY WHISPER EDS 4648 (WIRE) IMPLANT
WIRE HI TORQ VERSACORE-J 145CM (WIRE) IMPLANT

## 2022-04-04 NOTE — H&P (Addendum)
Cardiology Office Note Date:  04/04/2022  Patient ID:  Lauren Norton 05-18-1946, MRN [id number] PCP:  Prince Solian, MD  Cardiologist/Electrophysiologist: Dr. Rayann Heman    Chief Complaint:   ERI  History of Present Illness: Lauren Norton is a 76 y.o. female with history of HTN, HLD, DM, advanced heart block w/PPM  I saw her 02/19/2020 Upon entering the room she tells me her BP and HR are always high at the doctor and she gets very, very nervous and anxious.  She has been staying home and being out and about is also very anxiety provoking She is feeling well.  Reports walking nearly every day with excellent exertional capacity, no CP or palpitations, no cardiac awareness.  No dizzy spells near syncope or syncope. About a year ago she started noticing with some weight loss and her walking her BP started to lower, and noted on occasion as low as SBPs 89-90's and reduced her lisinopril to 2.'5mg'$  from 5 and her BP at home are very good, and rarely >120  She has labs done with her PMD No changes were made.  She last saw EP service with Jonni Sanger 02/17/21, mild SOB with hills in the heat.  He observed her to have occasional A lead noise, minimally reproducible at that visit with hand rubbing and isometrics noted for a couple years. Battery was 78moto ERI  Today, denies symptoms of palpitations, chest pain, shortness of breath, orthopnea, PND, lower extremity edema, claudication, dizziness, presyncope, syncope, bleeding, or neurologic sequela. The patient is tolerating medications without difficulties. Plan CRT-P upgrade today.    Past Medical History:  Diagnosis Date   Anxiety    Bronchitis 2018   Cataract 2017   both eyes   [place]    HLD (hyperlipidemia)    HTN (hypertension)    Osteoarthritis of both hips    both hips replaced   Pneumonia    many years ago   Presence of permanent cardiac pacemaker    2013   Second degree AV block    a.s/p MDT dual chamber PPM     Shingles    Skin cancer ~ 2007   "back"    Past Surgical History:  Procedure Laterality Date   ANTERIOR AND POSTERIOR REPAIR N/A 01/29/2018   Procedure: ANTERIOR (CYSTOCELE) AND POSTERIOR REPAIR (RECTOCELE);  Surgeon: RPaula Compton MD;  [facility];  Service: Gynecology;  Laterality: N/A;   CATARACT EXTRACTION, BILATERAL  2017   CYST REMOVAL NECK  2016   CYSTOSCOPY N/A 01/29/2018   Procedure: CYSTOSCOPY;  Surgeon: RPaula Compton MD;  [facility];  Service: Gynecology;  Laterality: N/A;   JOINT REPLACEMENT Bilateral    bilateral hip replacements   left elbow fracture     age 76  PERMANENT PACEMAKER INSERTION N/A 02/27/2012   a. MDT dual chamber PPM implanted by Dr ARayann Hemanfor Mobitz II   SKIN CANCER EXCISION  ~ 2007   [place] AND [place]ECTOMY  [place]   right; left   VAGINAL HYSTERECTOMY N/A 01/29/2018   Procedure: HYSTERECTOMY VAGINAL;  Surgeon: RPaula Compton MD;  [facility];  Service: Gynecology;  Laterality: N/A;    Current Facility-Administered Medications  Medication Dose Route Frequency Provider Last Rate Last Admin   0.9 %  sodium chloride infusion   Intravenous Continuous Markel Kurtenbach MHassell Done MD       ceFAZolin (ANCEF) IVPB 2g/100 mL premix  2 g Intravenous On  Call Aaren Krog, Ocie Doyne, MD       chlorhexidine (HIBICLENS) 4 % liquid 4 Application  4 Application Topical Once Taressa Rauh Hassell Done, MD       gentamicin (GARAMYCIN) 80 mg in sodium chloride 0.9 % 500 mL irrigation  80 mg Irrigation On Call Mckinsey Keagle Hassell Done, MD       povidone-iodine 10 % swab 2 Application  2 Application Topical Once Adamary Savary, Ocie Doyne, MD        Allergies:   Sulfa antibiotics   Social History:  The patient  reports that she has never smoked. She has never used smokeless tobacco. She reports current alcohol use of about 6.0 standard drinks of alcohol per week. She reports that she does not use drugs.   Family  History:  The patient's family history includes Alzheimer's disease in her mother; CAD in her father; Diabetes in her father; Heart attack in her father; Heart disease in her maternal grandmother; Hypertension in her father; Prostate cancer in her father.  ROS:  Please see the history of present illness.   Otherwise, review of systems is positive for none.   All other systems are reviewed and negative.   PHYSICAL EXAM: VS:  BP (!) 147/86   Pulse 74   Temp 97.7 F (36.5 C) (Temporal)   Resp 17   Ht '5\' 6"'$  (1.676 m)   Wt 65.3 kg   SpO2 98%   BMI 23.24 kg/m  , BMI Body mass index is 23.24 kg/m. GEN: Well nourished, well developed, in no acute distress  HEENT: normal  Neck: no JVD, carotid bruits, or masses Cardiac: RRR; no murmurs, rubs, or gallops,no edema  Respiratory:  clear to auscultation bilaterally, normal work of breathing GI: soft, nontender, nondistended, + BS MS: no deformity or atrophy  Skin: warm and dry Neuro:  Strength and sensation are intact Psych: euthymic mood, full affect   Recent Labs: 03/14/2022: BUN 26; Creatinine, Ser 0.93; Hemoglobin 13.7; Platelets 275; Potassium 4.4; Sodium 137  No results found for requested labs within last 365 days.   CrCl cannot be calculated (Patient's most recent lab result is older than the maximum 21 days allowed.).   Wt Readings from Last 3 Encounters:  03/17/22 65.3 kg  02/17/22 64.9 kg  02/08/22 65.9 kg     Other studies reviewed: Additional studies/records reviewed today include: summarized above  ASSESSMENT AND PLAN:  1. PPM Pacemaker at ERI. LVEF reduced likely due to RV pacing. Plan for CRT p upgrade  Lauren Norton has presented today for surgery, with the diagnosis of heart block, chf.  The various methods of treatment have been discussed with the patient and family. After consideration of risks, benefits and other options for treatment, the patient has consented to  Procedure(s): Pacemaker implant as a  surgical intervention .  Risks include but not limited to bleeding, infection, pneumothorax, perforation, tamponade, vascular damage, renal failure, MI, stroke, death, and lead dislodgement . The patient's history has been reviewed, patient examined, no change in status, stable for surgery.  I have reviewed the patient's chart and labs.  Questions were answered to the patient's satisfaction.    Cordarious Zeek Curt Bears, MD 04/04/2022 2:19 PM

## 2022-04-04 NOTE — Discharge Instructions (Signed)
     Supplemental Discharge Instructions for  Pacemaker/Defibrillator Patients  Tomorrow, 04/05/22, send in a device transmission  Activity No heavy lifting or vigorous activity with your left/right arm for 6 to 8 weeks.  Do not raise your left/right arm above your head for one week.  Gradually raise your affected arm as drawn below.              04/09/22                 04/10/22                   04/11/22                 04/12/22 __  NO DRIVING until cleared to at your wound check visit.  WOUND CARE Keep the wound area clean and dry.  Do not get this area wet , no showers until cleared to at you wound check visit Tomorrow, 04/05/22, remove the arm sling Tomorrow, 04/05/22 remove the LARGE outer plastic bandage.  Underneath the plastic bandage there are steri strips (paper tapes), DO NOT remove these. The tape/steri-strips on your wound will fall off; do not pull them off.  No bandage is needed on the site.  DO  NOT apply any creams, oils, or ointments to the wound area. If you notice any drainage or discharge from the wound, any swelling or bruising at the site, or you develop a fever > 101? F after you are discharged home, call the office at once.  Special Instructions You are still able to use cellular telephones; use the ear opposite the side where you have your pacemaker/defibrillator.  Avoid carrying your cellular phone near your device. When traveling through airports, show security personnel your identification card to avoid being screened in the metal detectors.  Ask the security personnel to use the hand wand. Avoid arc welding equipment, MRI testing (magnetic resonance imaging), TENS units (transcutaneous nerve stimulators).  Call the office for questions about other devices. Avoid electrical appliances that are in poor condition or are not properly grounded. Microwave ovens are safe to be near or to operate.

## 2022-04-04 NOTE — Progress Notes (Signed)
Spoke with Curt Bears, MD regarding patients CXR results. MD stated that patient was ok to discharge.

## 2022-04-05 ENCOUNTER — Encounter (HOSPITAL_COMMUNITY): Payer: Self-pay | Admitting: Cardiology

## 2022-04-05 ENCOUNTER — Telehealth: Payer: Self-pay

## 2022-04-05 MED FILL — Cefazolin Sodium For Inj 2 GM: INTRAMUSCULAR | Qty: 2 | Status: AC

## 2022-04-05 NOTE — Telephone Encounter (Signed)
-----   Message from [place], [place] sent at 04/04/2022  4:59 PM EDT ----- Same day d/c  [facility] upgrade to CRT-P  No a/c  WC

## 2022-04-05 NOTE — Telephone Encounter (Signed)
Patient called in stating her monitor is not working and she is getting a new one in the mail and when she receives it she will send one in

## 2022-04-05 NOTE — Telephone Encounter (Signed)
Follow-up after same day discharge: Implant date: 04/04/2022 MD: Allegra Lai, MD Device: [facility] CRT-P Location: left chest  Wound check visit: 04/19/2022 at 12:00 pm 90 day MD follow-up: scheduled  Remote Transmission received: no-[facility] is sending Pt a new monitor-her original one was not working.  Should receive it prior to her wound check appt and she will send a transmission  Dressing/sling removed: yes-states site looks good  Pt feels well.  Has number to device clinic with any questions/concerns.

## 2022-04-14 ENCOUNTER — Encounter (HOSPITAL_COMMUNITY): Payer: Self-pay | Admitting: Cardiology

## 2022-04-19 ENCOUNTER — Ambulatory Visit: Payer: Medicare Other | Attending: Cardiology

## 2022-04-19 ENCOUNTER — Telehealth: Payer: Self-pay | Admitting: *Deleted

## 2022-04-19 ENCOUNTER — Ambulatory Visit
Admission: RE | Admit: 2022-04-19 | Discharge: 2022-04-19 | Disposition: A | Discharge status: Home / Self Care | Payer: Medicare Other | Source: Ambulatory Visit | Attending: Cardiology | Admitting: Cardiology

## 2022-04-19 DIAGNOSIS — I442 Atrioventricular block, complete: Secondary | ICD-10-CM

## 2022-04-19 LAB — CUP PACEART INCLINIC DEVICE CHECK
Battery Remaining Longevity: 37 mo
Battery Voltage: 2.96 V
Brady Statistic RA Percent Paced: 11 %
Brady Statistic RV Percent Paced: 99.94 %
Date Time Interrogation Session: 20231108163749
Implantable Lead Connection Status: 753985
Implantable Lead Connection Status: 753985
Implantable Lead Connection Status: 753985
Implantable Lead Implant Date: 20130917
Implantable Lead Implant Date: 20130917
Implantable Lead Implant Date: 20231024
Implantable Lead Location: 753858
Implantable Lead Location: 753859
Implantable Lead Location: 753860
Implantable Lead Model: 1944
Implantable Lead Model: 1948
Implantable Pulse Generator Implant Date: 20231024
Lead Channel Impedance Value: 487.5 Ohm
Lead Channel Impedance Value: 612.5 Ohm
Lead Channel Impedance Value: 637.5 Ohm
Lead Channel Pacing Threshold Amplitude: 0.5 V
Lead Channel Pacing Threshold Amplitude: 0.5 V
Lead Channel Pacing Threshold Amplitude: 0.75 V
Lead Channel Pacing Threshold Amplitude: 0.75 V
Lead Channel Pacing Threshold Amplitude: 2 V
Lead Channel Pacing Threshold Amplitude: 2 V
Lead Channel Pacing Threshold Pulse Width: 0.4 ms
Lead Channel Pacing Threshold Pulse Width: 0.4 ms
Lead Channel Pacing Threshold Pulse Width: 0.5 ms
Lead Channel Pacing Threshold Pulse Width: 0.5 ms
Lead Channel Pacing Threshold Pulse Width: 1 ms
Lead Channel Pacing Threshold Pulse Width: 1 ms
Lead Channel Sensing Intrinsic Amplitude: 2.9 mV
Lead Channel Setting Pacing Amplitude: 1.25 V
Lead Channel Setting Pacing Amplitude: 2 V
Lead Channel Setting Pacing Amplitude: 4 V
Lead Channel Setting Pacing Pulse Width: 0.5 ms
Lead Channel Setting Pacing Pulse Width: 1 ms
Lead Channel Setting Sensing Sensitivity: 4 mV
Pulse Gen Model: 3562
Pulse Gen Serial Number: 6596285

## 2022-04-19 NOTE — Telephone Encounter (Signed)
Pt aware office would be calling her to schedule an OV w/ Dr. Curt Bears to discuss LV lead revision. Patient verbalized understanding and agreeable to plan.

## 2022-04-19 NOTE — Patient Instructions (Signed)
   After Your Pacemaker   Monitor your pacemaker site for redness, swelling, and drainage. Call the device clinic at [phone] if you experience these symptoms or fever/chills.  Your incision was closed with Steri-strips or staples:  You may shower 7 days after your procedure and wash your incision with soap and water. Avoid lotions, ointments, or perfumes over your incision until it is well-healed.   Do not lift, push or pull greater than 10 pounds with the affected arm until May 17 2022 . There are no other restrictions in arm movement after your wound check appointment.  You may drive, unless driving has been restricted by your healthcare providers.   Remote monitoring is used to monitor your pacemaker from home. This monitoring is scheduled every 91 days by our office. It allows [place] to keep an eye on the functioning of your device to ensure it is working properly. You will routinely see your Electrophysiologist annually (more often if necessary).

## 2022-04-19 NOTE — Progress Notes (Signed)
Wound check appointment. Steri-strips removed. Wound without redness or edema. Incision edges approximated, wound well healed. Normal device function. Chronic RA and RV lead thresholds, sensing, and impedances consistent with implant measurements. Unable to obtain capture on LV lead with any Mid poles, at 6.00V.  Capture noted on D1-P4 2.00V'@1'$ .66m reviewed with Dr. CCurt BearsLV programmed D1-P4 with output 4.00V'@1'$ .056m Patient to obtain chest x ray today.   Chest x-ray reviewed by Dr. CaCurt BearsV lead has pulled back, patient scheduled  05/02/22 at 8:45 to discuss revision

## 2022-04-20 ENCOUNTER — Encounter: Payer: Self-pay | Admitting: Cardiology

## 2022-04-21 NOTE — Telephone Encounter (Signed)
Spoke with patient on the phone today to inform her that we do not want her to have any dental procedures for 6 weeks postop both the PPM insertion and upcoming lead revision due to concerns for high risk for bacteremia during dental procedures.   She needs to call and cancel her procedure for Monday. Patient verbalized understanding.

## 2022-04-25 ENCOUNTER — Ambulatory Visit: Payer: Medicare Other | Admitting: Gastroenterology

## 2022-05-02 ENCOUNTER — Ambulatory Visit: Payer: Medicare Other | Attending: Cardiology | Admitting: Cardiology

## 2022-05-02 ENCOUNTER — Encounter: Payer: Self-pay | Admitting: *Deleted

## 2022-05-02 ENCOUNTER — Encounter: Payer: Self-pay | Admitting: Cardiology

## 2022-05-02 VITALS — BP 130/80 | HR 80 | Ht 66.0 in | Wt 146.0 lb

## 2022-05-02 DIAGNOSIS — Z01812 Encounter for preprocedural laboratory examination: Secondary | ICD-10-CM | POA: Diagnosis not present

## 2022-05-02 DIAGNOSIS — I442 Atrioventricular block, complete: Secondary | ICD-10-CM | POA: Diagnosis not present

## 2022-05-02 LAB — CBC
Hematocrit: 41.4 % (ref 34.0–46.6)
Hemoglobin: 13.2 g/dL (ref 11.1–15.9)
MCH: 27.6 pg (ref 26.6–33.0)
MCHC: 31.9 g/dL (ref 31.5–35.7)
MCV: 87 fL (ref 79–97)
Platelets: 270 10*3/uL (ref 150–450)
RBC: 4.78 x10E6/uL (ref 3.77–5.28)
RDW: 13.3 % (ref 11.7–15.4)
WBC: 6 10*3/uL (ref 3.4–10.8)

## 2022-05-02 LAB — BASIC METABOLIC PANEL
BUN/Creatinine Ratio: 22 (ref 12–28)
BUN: 19 mg/dL (ref 8–27)
CO2: 26 mmol/L (ref 20–29)
Calcium: 10.4 mg/dL — ABNORMAL HIGH (ref 8.7–10.3)
Chloride: 100 mmol/L (ref 96–106)
Creatinine, Ser: 0.86 mg/dL (ref 0.57–1.00)
Glucose: 130 mg/dL — ABNORMAL HIGH (ref 70–99)
Potassium: 4.8 mmol/L (ref 3.5–5.2)
Sodium: 140 mmol/L (ref 134–144)
eGFR: 70 mL/min/{1.73_m2} (ref >59)

## 2022-05-02 NOTE — Progress Notes (Signed)
Electrophysiology Office Note   Date:  05/02/2022   ID:  RENNIE HACK, DOB 11/09/1945, MRN [id number]  PCP:  Prince Solian, MD  Cardiologist:   Primary Electrophysiologist:  Lamiracle Chaidez Meredith Leeds, MD    Chief Complaint: heart block   History of Present Illness: Lauren Norton is a 76 y.o. female who is being seen today for the evaluation of heart block at the request of Avva, Ravisankar, MD. Presenting today for electrophysiology evaluation.  She has a history, hyperlipidemia, diabetes, complete heart block.  She is status post Abbott dual-chamber pacemaker.  She had generator change with device upgrade due to a mildly reduced ejection fraction on 04/04/2022.  Unfortunately her coronary sinus lead has since dislodged and is in the os of the coronary sinus.  Today, she denies symptoms of palpitations, chest pain, shortness of breath, orthopnea, PND, lower extremity edema, claudication, dizziness, presyncope, syncope, bleeding, or neurologic sequela. The patient is tolerating medications without difficulties.  Immediately after her procedure she felt well.  Unfortunately she did develop recurrent weakness and fatigue for 3 or 4 days later.   Past Medical History:  Diagnosis Date   Anxiety    Bronchitis 2018   Cataract 2017   both eyes   Diabetes ([place])    HLD (hyperlipidemia)    HTN (hypertension)    Osteoarthritis of both hips    both hips replaced   Pneumonia    many years ago   Presence of permanent cardiac pacemaker    2013   Second degree AV block    a.s/p MDT dual chamber PPM    Shingles    Skin cancer ~ 2007   "back"   Past Surgical History:  Procedure Laterality Date   ANTERIOR AND POSTERIOR REPAIR N/A 01/29/2018   Procedure: ANTERIOR (CYSTOCELE) AND POSTERIOR REPAIR (RECTOCELE);  Surgeon: Paula Compton, MD;  Location: [facility];  Service: Gynecology;  Laterality: N/A;   BIV UPGRADE N/A 04/04/2022   Procedure: BIV UPGRADE;  Surgeon: Constance Haw, MD;  Location: [facility];  Service: Cardiovascular;  Laterality: N/A;   CATARACT EXTRACTION, BILATERAL  2017   CYST REMOVAL NECK  2016   CYSTOSCOPY N/A 01/29/2018   Procedure: CYSTOSCOPY;  Surgeon: Paula Compton, MD;  Location: [facility];  Service: Gynecology;  Laterality: N/A;   JOINT REPLACEMENT Bilateral    bilateral hip replacements   left elbow fracture     age 57   PERMANENT PACEMAKER INSERTION N/A 02/27/2012   a. MDT dual chamber PPM implanted by Dr Rayann Heman for Mobitz II   SKIN CANCER EXCISION  ~ 2007   TONSILLECTOMY AND ADENOIDECTOMY  ~ [place] Bilateral 2001; 2012   right; left   VAGINAL HYSTERECTOMY N/A 01/29/2018   Procedure: HYSTERECTOMY VAGINAL;  Surgeon: Paula Compton, MD;  Location: [facility];  Service: Gynecology;  Laterality: N/A;     Current Outpatient Medications  Medication Sig Dispense Refill   alendronate (FOSAMAX) 70 MG tablet Take 70 mg by mouth every Tuesday.     atorvastatin (LIPITOR) 10 MG tablet Take 10 mg by mouth daily with supper.      Biotin 5000 MCG TABS Take 1 tablet by mouth daily.     Calcium Citrate-Vitamin D (CALCIUM + D PO) Take 1 tablet by mouth 2 (two) times daily.     CINNAMON PO Take 2 tablets by mouth daily.     ibuprofen (ADVIL) 200 MG tablet Take 200 mg by mouth every 6 (  six) hours as needed for moderate pain.     lisinopril (ZESTRIL) 2.5 MG tablet Take 0.5 tablets by mouth 2 (two) times daily.     loratadine (CLARITIN) 10 MG tablet Take 10 mg by mouth at bedtime as needed for allergies.     metFORMIN (GLUCOPHAGE) 500 MG tablet Take 500-1,000 mg by mouth See admin instructions. Take '500mg'$  by mouth in the morning, '500mg'$  at lunch and '1000mg'$  at supper time     metoprolol succinate (TOPROL-XL) 25 MG 24 hr tablet Take 1 tablet (25 mg total) by mouth daily. 90 tablet 1   polyethylene glycol powder (GLYCOLAX/MIRALAX) powder Take 17 g by mouth daily as needed for moderate constipation.     Pumpkin Seed-Soy  Germ (AZO BLADDER CONTROL/GO-LESS) CAPS Take 2 tablets by mouth daily.     sodium chloride (MURO 128) 2 % ophthalmic solution Place 1 drop into both eyes daily.     tretinoin (RETIN-A) 0.05 % cream Apply 1 Application topically every other day. At night     No current facility-administered medications for this visit.    Allergies:   Sulfa antibiotics   Social History:  The patient  reports that she has never smoked. She has never used smokeless tobacco. She reports current alcohol use of about 6.0 standard drinks of alcohol per week. She reports that she does not use drugs.   Family History:  The patient's family history includes Alzheimer's disease in her mother; CAD in her father; Diabetes in her father; Heart attack in her father; Heart disease in her maternal grandmother; Hypertension in her father; Prostate cancer in her father.    ROS:  Please see the history of present illness.   Otherwise, review of systems is positive for none.   All other systems are reviewed and negative.    PHYSICAL EXAM: VS:  BP 130/80 (BP Location: Left Arm, Patient Position: Sitting, Cuff Size: Normal)   Pulse 80   Ht '5\' 6"'$  (1.676 m)   Wt 146 lb (66.2 kg)   SpO2 97%   BMI 23.57 kg/m  , BMI Body mass index is 23.57 kg/m. GEN: Well nourished, well developed, in no acute distress  HEENT: normal  Neck: no JVD, carotid bruits, or masses Cardiac: RRR; no murmurs, rubs, or gallops,no edema  Respiratory:  clear to auscultation bilaterally, normal work of breathing GI: soft, nontender, nondistended, + BS MS: no deformity or atrophy  Skin: warm and dry, device pocket is well healed Neuro:  Strength and sensation are intact Psych: euthymic mood, full affect  EKG:  EKG is not ordered today. Personal review of the ekg ordered 04/04/22 shows A sense, V pace  Device interrogation is reviewed today in detail.  See PaceArt for details.   Recent Labs: 03/14/2022: BUN 26; Creatinine, Ser 0.93; Hemoglobin 13.7;  Platelets 275; Potassium 4.4; Sodium 137    Lipid Panel  No results found for: "CHOL", "TRIG", "HDL", "CHOLHDL", "VLDL", "LDLCALC", "LDLDIRECT"   Wt Readings from Last 3 Encounters:  05/02/22 146 lb (66.2 kg)  04/04/22 144 lb (65.3 kg)  03/17/22 144 lb (65.3 kg)      Other studies Reviewed: Additional studies/ records that were reviewed today include: TTE 03/14/22  Review of the above records today demonstrates:   1. Left ventricular ejection fraction, by estimation, is 45 to 50%. The  left ventricle has mildly decreased function. The left ventricle  demonstrates regional wall motion abnormalities. Left ventricular  diastolic parameters are indeterminate. Abnormal  (paradoxical) septal motion, consistent  with RV pacemaker   2. Right ventricular systolic function is normal. The right ventricular  size is normal. Tricuspid regurgitation signal is inadequate for assessing  PA pressure.   3. The mitral valve is normal in structure. Trivial mitral valve  regurgitation. No evidence of mitral stenosis.   4. The aortic valve is tricuspid. Aortic valve regurgitation is not  visualized. No aortic stenosis is present.   5. The inferior vena cava is normal in size with greater than 50%  respiratory variability, suggesting right atrial pressure of 3 mmHg.    ASSESSMENT AND PLAN:  1.  Complete heart block: Status post Abbott CRT-P.  She is status post device upgrade 04/04/2022.  Unfortunately her coronary sinus lead has since dislodged and is in the os of the coronary sinus.  She Ermias Tomeo need lead revision.  Risk and benefits have been discussed.  Risk include bleeding, tamponade, infection, pneumothorax.  She understands these risks and is agreed to the procedure.  2.  Hypertension: Currently well controlled  3.  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  Plan for lead revision as above.    Current medicines are reviewed at length with the patient today.   The patient  does not have concerns regarding her medicines.  The following changes were made today:  none  Labs/ tests ordered today include:  Orders Placed This Encounter  Procedures   Basic metabolic panel   CBC     Disposition:   FU with Niki Payment 3 months  Signed, Rayya Yagi Meredith Leeds, MD  05/02/2022 10:33 AM     [place] [address] [place] [address] [phone] (office) [phone] (fax)

## 2022-05-02 NOTE — H&P (View-Only) (Signed)
Electrophysiology Office Note   Date:  05/02/2022   ID:  Lauren Norton, DOB [birth year], MRN [id number]  PCP:  Prince Solian, MD  Cardiologist:   Primary Electrophysiologist:  Treyon Wymore Meredith Leeds, MD    Chief Complaint: heart block   History of Present Illness: Lauren Norton is a 76 y.o. female who is being seen today for the evaluation of heart block at the request of Avva, Ravisankar, MD. Presenting today for electrophysiology evaluation.  She has a history, hyperlipidemia, diabetes, complete heart block.  She is status post Abbott dual-chamber pacemaker.  She had generator change with device upgrade due to a mildly reduced ejection fraction on 04/04/2022.  Unfortunately her coronary sinus lead has since dislodged and is in the os of the coronary sinus.  Today, she denies symptoms of palpitations, chest pain, shortness of breath, orthopnea, PND, lower extremity edema, claudication, dizziness, presyncope, syncope, bleeding, or neurologic sequela. The patient is tolerating medications without difficulties.  Immediately after her procedure she felt well.  Unfortunately she did develop recurrent weakness and fatigue for 3 or 4 days later.   Past Medical History:  Diagnosis Date   Anxiety    Bronchitis 2018   Cataract 2017   both eyes   Diabetes ([place])    HLD (hyperlipidemia)    HTN (hypertension)    Osteoarthritis of both hips    both hips replaced   Pneumonia    many years ago   Presence of permanent cardiac pacemaker    2013   Second degree AV block    a.s/p MDT dual chamber PPM    Shingles    Skin cancer ~ 2007   "back"   Past Surgical History:  Procedure Laterality Date   ANTERIOR AND POSTERIOR REPAIR N/A 01/29/2018   Procedure: ANTERIOR (CYSTOCELE) AND POSTERIOR REPAIR (RECTOCELE);  Surgeon: Paula Compton, MD;  Location: [facility];  Service: Gynecology;  Laterality: N/A;   BIV UPGRADE N/A 04/04/2022   Procedure: BIV UPGRADE;  Surgeon: Constance Haw, MD;  Location: [facility];  Service: Cardiovascular;  Laterality: N/A;   CATARACT EXTRACTION, BILATERAL  2017   CYST REMOVAL NECK  2016   CYSTOSCOPY N/A 01/29/2018   Procedure: CYSTOSCOPY;  Surgeon: Paula Compton, MD;  Location: [facility];  Service: Gynecology;  Laterality: N/A;   JOINT REPLACEMENT Bilateral    bilateral hip replacements   left elbow fracture     age 86   PERMANENT PACEMAKER INSERTION N/A 02/27/2012   a. MDT dual chamber PPM implanted by Dr Rayann Heman for Mobitz II   SKIN CANCER EXCISION  ~ 2007   TONSILLECTOMY AND ADENOIDECTOMY  ~ [place] Bilateral 2001; 2012   right; left   VAGINAL HYSTERECTOMY N/A 01/29/2018   Procedure: HYSTERECTOMY VAGINAL;  Surgeon: Paula Compton, MD;  Location: [facility];  Service: Gynecology;  Laterality: N/A;     Current Outpatient Medications  Medication Sig Dispense Refill   alendronate (FOSAMAX) 70 MG tablet Take 70 mg by mouth every Tuesday.     atorvastatin (LIPITOR) 10 MG tablet Take 10 mg by mouth daily with supper.      Biotin 5000 MCG TABS Take 1 tablet by mouth daily.     Calcium Citrate-Vitamin D (CALCIUM + D PO) Take 1 tablet by mouth 2 (two) times daily.     CINNAMON PO Take 2 tablets by mouth daily.     ibuprofen (ADVIL) 200 MG tablet Take 200 mg by mouth every 6 (  six) hours as needed for moderate pain.     lisinopril (ZESTRIL) 2.5 MG tablet Take 0.5 tablets by mouth 2 (two) times daily.     loratadine (CLARITIN) 10 MG tablet Take 10 mg by mouth at bedtime as needed for allergies.     metFORMIN (GLUCOPHAGE) 500 MG tablet Take 500-1,000 mg by mouth See admin instructions. Take '500mg'$  by mouth in the morning, '500mg'$  at lunch and '1000mg'$  at supper time     metoprolol succinate (TOPROL-XL) 25 MG 24 hr tablet Take 1 tablet (25 mg total) by mouth daily. 90 tablet 1   polyethylene glycol powder (GLYCOLAX/MIRALAX) powder Take 17 g by mouth daily as needed for moderate constipation.     Pumpkin Seed-Soy  Germ (AZO BLADDER CONTROL/GO-LESS) CAPS Take 2 tablets by mouth daily.     sodium chloride (MURO 128) 2 % ophthalmic solution Place 1 drop into both eyes daily.     tretinoin (RETIN-A) 0.05 % cream Apply 1 Application topically every other day. At night     No current facility-administered medications for this visit.    Allergies:   Sulfa antibiotics   Social History:  The patient  reports that she has never smoked. She has never used smokeless tobacco. She reports current alcohol use of about 6.0 standard drinks of alcohol per week. She reports that she does not use drugs.   Family History:  The patient's family history includes Alzheimer's disease in her mother; CAD in her father; Diabetes in her father; Heart attack in her father; Heart disease in her maternal grandmother; Hypertension in her father; Prostate cancer in her father.    ROS:  Please see the history of present illness.   Otherwise, review of systems is positive for none.   All other systems are reviewed and negative.    PHYSICAL EXAM: VS:  BP 130/80 (BP Location: Left Arm, Patient Position: Sitting, Cuff Size: Normal)   Pulse 80   Ht '5\' 6"'$  (1.676 m)   Wt 146 lb (66.2 kg)   SpO2 97%   BMI 23.57 kg/m  , BMI Body mass index is 23.57 kg/m. GEN: Well nourished, well developed, in no acute distress  HEENT: normal  Neck: no JVD, carotid bruits, or masses Cardiac: RRR; no murmurs, rubs, or gallops,no edema  Respiratory:  clear to auscultation bilaterally, normal work of breathing GI: soft, nontender, nondistended, + BS MS: no deformity or atrophy  Skin: warm and dry, device pocket is well healed Neuro:  Strength and sensation are intact Psych: euthymic mood, full affect  EKG:  EKG is not ordered today. Personal review of the ekg ordered 04/04/22 shows A sense, V pace  Device interrogation is reviewed today in detail.  See PaceArt for details.   Recent Labs: 03/14/2022: BUN 26; Creatinine, Ser 0.93; Hemoglobin 13.7;  Platelets 275; Potassium 4.4; Sodium 137    Lipid Panel  No results found for: "CHOL", "TRIG", "HDL", "CHOLHDL", "VLDL", "LDLCALC", "LDLDIRECT"   Wt Readings from Last 3 Encounters:  05/02/22 146 lb (66.2 kg)  04/04/22 144 lb (65.3 kg)  03/17/22 144 lb (65.3 kg)      Other studies Reviewed: Additional studies/ records that were reviewed today include: TTE 03/14/22  Review of the above records today demonstrates:   1. Left ventricular ejection fraction, by estimation, is 45 to 50%. The  left ventricle has mildly decreased function. The left ventricle  demonstrates regional wall motion abnormalities. Left ventricular  diastolic parameters are indeterminate. Abnormal  (paradoxical) septal motion, consistent  with RV pacemaker   2. Right ventricular systolic function is normal. The right ventricular  size is normal. Tricuspid regurgitation signal is inadequate for assessing  PA pressure.   3. The mitral valve is normal in structure. Trivial mitral valve  regurgitation. No evidence of mitral stenosis.   4. The aortic valve is tricuspid. Aortic valve regurgitation is not  visualized. No aortic stenosis is present.   5. The inferior vena cava is normal in size with greater than 50%  respiratory variability, suggesting right atrial pressure of 3 mmHg.    ASSESSMENT AND PLAN:  1.  Complete heart block: Status post Abbott CRT-P.  She is status post device upgrade 04/04/2022.  Unfortunately her coronary sinus lead has since dislodged and is in the os of the coronary sinus.  She Merlin Ege need lead revision.  Risk and benefits have been discussed.  Risk include bleeding, tamponade, infection, pneumothorax.  She understands these risks and is agreed to the procedure.  2.  Hypertension: Currently well controlled  3.  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  Plan for lead revision as above.    Current medicines are reviewed at length with the patient today.   The patient  does not have concerns regarding her medicines.  The following changes were made today:  none  Labs/ tests ordered today include:  Orders Placed This Encounter  Procedures   Basic metabolic panel   CBC     Disposition:   FU with Jarrah Seher 3 months  Signed, Graiden Henes Meredith Leeds, MD  05/02/2022 10:33 AM     [place] [address] [place] [address] [phone] (office) [phone] (fax)

## 2022-05-02 NOTE — Patient Instructions (Signed)
Medication Instructions:  Your physician recommends that you continue on your current medications as directed. Please refer to the Current Medication list given to you today.  *If you need a refill on your cardiac medications before your next appointment, please call your pharmacy*   Lab Work: None ordered If you have labs (blood work) drawn today and your tests are completely normal, you will receive your results only by: [place] (if you have MyChart) OR A paper copy in the mail If you have any lab test that is abnormal or we need to change your treatment, we will call you to review the results.   Testing/Procedures: Your physician has recommended that you have a pacemaker lead revision.  Please see the instruction sheet given to you today for more information.   Follow-Up: At [facility], you and your health needs are our priority.  As part of our continuing mission to provide you with exceptional heart care, we have created designated Provider Care Teams.  These Care Teams include your primary Cardiologist (physician) and Advanced Practice Providers (APPs -  Physician Assistants and Nurse Practitioners) who all work together to provide you with the care you need, when you need it.  Your next appointment:   2 week(s) after your pacemaker lead revision  The format for your next appointment:   In Person  Provider:   Device clinic for a wound check     Thank you for choosing [facility]!!   Trinidad Curet, RN [phone]  Other Instructions   Important Information About Sugar

## 2022-05-12 ENCOUNTER — Encounter: Payer: Self-pay | Admitting: Gastroenterology

## 2022-05-12 ENCOUNTER — Ambulatory Visit: Payer: Medicare Other | Admitting: Gastroenterology

## 2022-05-12 VITALS — BP 130/90 | HR 86 | Ht 66.0 in | Wt 146.0 lb

## 2022-05-12 DIAGNOSIS — K5909 Other constipation: Secondary | ICD-10-CM

## 2022-05-12 DIAGNOSIS — K641 Second degree hemorrhoids: Secondary | ICD-10-CM

## 2022-05-12 NOTE — Progress Notes (Signed)
Chief Complaint:    Symptomatic Internal Hemorrhoids; Hemorrhoid Band Ligation  GI History: Lauren Norton is a 76 y.o. 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     History of chronic constipation, which is generally well controlled with high-fiber diet and MiraLAX.  Uses Miralax prn. Constipation since childhood.    History of hemorrhoids, which she has historically treated with topical hydrocortisone prn. More relief with A+D ointment.  Has had hemorrhoidal sxs for 10+ years. Worse with exacerbation of constipation. Index sxs of itching, irritation, seepage/incomplete cleaning ; rare scant BRBPR.   Was seen in 2016 for hemorrhoidal sxs and constipation. Colonoscopy at that time with a single benign polyp and internal hemorrhoids.  - 03/17/2022: Banding of RP hemorrhoid:     Endoscopic History: - Colonoscopy in [place] in the early 2000's which was reportedly normal/no polyps - Colonoscopy (10/30/2014): 3 mm descending colon polyp (path: Benign lymphoid aggregate), Internal hemorrhoids.  Repeat in 10 years  HPI:     Patient is a 76 y.o. femalewith a history of symptomatic internal hemorrhoids presenting to the Gastroenterology Clinic for follow-up and ongoing treatment. The patient presents with symptomatic grade 2 hemorrhoids, unresponsive to maximal medical therapy, requesting rubber band ligation of symptomatic hemorrhoidal disease.  Did well with the first hemorrhoid banding with overall good response.  No change in medical or surgical history, medications, allergies, social history since last appointment with me.   Review of systems:     No chest pain, no SOB, no fevers, no urinary sx   Past Medical History:  Diagnosis Date   Anxiety    Bronchitis 2018   Cataract 2017   both eyes   Diabetes ([place])    HLD (hyperlipidemia)    HTN  (hypertension)    Osteoarthritis of both hips    both hips replaced   Pneumonia    many years ago   Presence of permanent cardiac pacemaker    2013   Second degree AV block    a.s/p MDT dual chamber PPM    Shingles    Skin cancer ~ 2007   "back"    Patient's surgical history, family medical history, social history, medications and allergies were all reviewed in Epic    Current Outpatient Medications  Medication Sig Dispense Refill   alendronate (FOSAMAX) 70 MG tablet Take 70 mg by mouth every Tuesday.     atorvastatin (LIPITOR) 10 MG tablet Take 10 mg by mouth daily with supper.      Biotin 5000 MCG TABS Take 1 tablet by mouth daily.     Calcium Citrate-Vitamin D (CALCIUM + D PO) Take 1 tablet by mouth 2 (two) times daily.     CINNAMON PO Take 2 tablets by mouth daily.     ibuprofen (ADVIL) 200 MG tablet Take 200 mg by mouth every 6 (six) hours as needed for moderate pain.     lisinopril (ZESTRIL) 2.5 MG tablet Take 0.5 tablets by mouth 2 (two) times daily.     loratadine (CLARITIN) 10 MG tablet Take 10 mg by mouth at bedtime as needed for allergies.     metFORMIN (GLUCOPHAGE) 500 MG tablet Take 500-1,000 mg by mouth See admin instructions. Take '500mg'$  by mouth in the morning, '500mg'$  at lunch and '1000mg'$  at supper time     metoprolol succinate (TOPROL-XL) 25 MG 24 hr tablet Take 1 tablet (25  mg total) by mouth daily. 90 tablet 1   polyethylene glycol powder (GLYCOLAX/MIRALAX) powder Take 17 g by mouth daily as needed for moderate constipation.     Pumpkin Seed-Soy Germ (AZO BLADDER CONTROL/GO-LESS) CAPS Take 2 tablets by mouth daily.     sodium chloride (MURO 128) 2 % ophthalmic solution Place 1 drop into both eyes daily.     tretinoin (RETIN-A) 0.05 % cream Apply 1 Application topically every other day. At night     No current facility-administered medications for this visit.    Physical Exam:     BP (!) 130/90   Pulse 86   Ht '5\' 6"'$  (1.676 m)   Wt 146 lb (66.2 kg)   BMI  23.57 kg/m   GENERAL:  Pleasant female in NAD PSYCH: : Cooperative, normal affect NEURO: Alert and oriented x 3, no focal neurologic deficits Rectal exam: Sensation intact and preserved anal wink.  Grade 2 hemorrhoids noted in LL and RA positions on exam.  No external anal fissures noted. Normal sphincter tone. No palpable mass. No blood on the exam glove. (Chaperone: Lauren Norton, CMA).   IMPRESSION and PLAN:    #1.  Symptomatic internal hemorrhoids: PROCEDURE NOTE: The patient presents with symptomatic grade 2 hemorrhoids, unresponsive to maximal medical therapy, requesting rubber band ligation of symptomatic hemorrhoidal disease.  All risks, benefits and alternative forms of therapy were described and informed consent was obtained.  In the Left Lateral Decubitus position, anoscopic examination revealed grade 2 hemorrhoids in the LL and RA position(s).  The anorectum was pre-medicated with RectiCare. The decision was made to band the LL internal hemorrhoid, and the [facility] system was used to perform band ligation without complication.  Digital anorectal examination was then performed to assure proper positioning of the band, and to adjust the banded tissue as required.  The patient was discharged home without pain or other issues.  Dietary and behavioral recommendations were given and along with follow-up instructions.     The following adjunctive treatments were recommended:  -Resume high-fiber diet with fiber supplement (i.e. Citrucel or Benefiber) with goal for soft stools without straining to have a BM. -Resume adequate fluid intake.  The patient will return in 4 for follow-up and possible additional banding as required. No complications were encountered and the patient tolerated the procedure well.      #2.  Chronic constipation -Continue high-fiber diet with MiraLAX prn - Continue adequate hydration      Lavena Bullion ,DO, FACG 05/12/2022, 9:47 AM

## 2022-05-12 NOTE — Patient Instructions (Signed)

## 2022-05-18 NOTE — Pre-Procedure Instructions (Signed)
Instructed patient on the following items: Arrival time 1400 Nothing to eat or drink after midnight No meds AM of procedure Responsible person to drive you home and stay with you for 24 hrs Wash with special soap night before and morning of procedure

## 2022-05-19 ENCOUNTER — Other Ambulatory Visit: Payer: Self-pay

## 2022-05-19 ENCOUNTER — Ambulatory Visit (HOSPITAL_COMMUNITY)
Admission: RE | Admit: 2022-05-19 | Discharge: 2022-05-20 | Disposition: A | Discharge status: Home / Self Care | Payer: Medicare Other | Attending: Cardiology | Admitting: Cardiology

## 2022-05-19 ENCOUNTER — Ambulatory Visit (HOSPITAL_COMMUNITY)
Admission: RE | Disposition: A | Discharge status: Home / Self Care | Payer: Self-pay | Source: Home / Self Care | Attending: Cardiology

## 2022-05-19 DIAGNOSIS — Z794 Long term (current) use of insulin: Secondary | ICD-10-CM | POA: Insufficient documentation

## 2022-05-19 DIAGNOSIS — Y658 Other specified misadventures during surgical and medical care: Secondary | ICD-10-CM | POA: Insufficient documentation

## 2022-05-19 DIAGNOSIS — I442 Atrioventricular block, complete: Secondary | ICD-10-CM | POA: Diagnosis present

## 2022-05-19 DIAGNOSIS — I5022 Chronic systolic (congestive) heart failure: Secondary | ICD-10-CM | POA: Diagnosis not present

## 2022-05-19 DIAGNOSIS — I10 Essential (primary) hypertension: Secondary | ICD-10-CM | POA: Diagnosis present

## 2022-05-19 DIAGNOSIS — E1136 Type 2 diabetes mellitus with diabetic cataract: Secondary | ICD-10-CM | POA: Diagnosis not present

## 2022-05-19 DIAGNOSIS — T82120A Displacement of cardiac electrode, initial encounter: Secondary | ICD-10-CM | POA: Diagnosis not present

## 2022-05-19 DIAGNOSIS — E785 Hyperlipidemia, unspecified: Secondary | ICD-10-CM | POA: Diagnosis not present

## 2022-05-19 DIAGNOSIS — I110 Hypertensive heart disease with heart failure: Secondary | ICD-10-CM | POA: Insufficient documentation

## 2022-05-19 DIAGNOSIS — I5020 Unspecified systolic (congestive) heart failure: Secondary | ICD-10-CM | POA: Insufficient documentation

## 2022-05-19 DIAGNOSIS — E119 Type 2 diabetes mellitus without complications: Secondary | ICD-10-CM

## 2022-05-19 DIAGNOSIS — Z950 Presence of cardiac pacemaker: Secondary | ICD-10-CM | POA: Diagnosis present

## 2022-05-19 HISTORY — PX: LEAD REVISION/REPAIR: EP1213

## 2022-05-19 LAB — GLUCOSE, CAPILLARY
Glucose-Capillary: 117 mg/dL — ABNORMAL HIGH (ref 70–99)
Glucose-Capillary: 93 mg/dL (ref 70–99)
Glucose-Capillary: 160 mg/dL — ABNORMAL HIGH (ref 70–99)

## 2022-05-19 SURGERY — LEAD REVISION/REPAIR

## 2022-05-19 MED ORDER — MIDAZOLAM HCL 5 MG/5ML IJ SOLN
INTRAMUSCULAR | Status: AC
  Filled 2022-05-19: qty 5

## 2022-05-19 MED ORDER — IOHEXOL 350 MG/ML SOLN
INTRAVENOUS | Status: DC | PRN
  Administered 2022-05-19: 25 mL

## 2022-05-19 MED ORDER — CEFAZOLIN SODIUM-DEXTROSE 2-4 GM/100ML-% IV SOLN
2.0000 g | INTRAVENOUS | Status: AC
  Administered 2022-05-19: 2 g via INTRAVENOUS

## 2022-05-19 MED ORDER — MIDAZOLAM HCL 5 MG/5ML IJ SOLN
INTRAMUSCULAR | Status: DC | PRN
  Administered 2022-05-19: 1 mg via INTRAVENOUS

## 2022-05-19 MED ORDER — FENTANYL CITRATE (PF) 100 MCG/2ML IJ SOLN
INTRAMUSCULAR | Status: DC | PRN
  Administered 2022-05-19: 25 ug via INTRAVENOUS

## 2022-05-19 MED ORDER — HEPARIN (PORCINE) IN NACL 1000-0.9 UT/500ML-% IV SOLN
INTRAVENOUS | Status: AC
  Filled 2022-05-19: qty 500

## 2022-05-19 MED ORDER — ATORVASTATIN CALCIUM 10 MG PO TABS: 10.0000 mg | ORAL_TABLET | Freq: Every day | ORAL | Status: DC

## 2022-05-19 MED ORDER — SODIUM CHLORIDE 0.9 % IV SOLN: INTRAVENOUS | Status: DC

## 2022-05-19 MED ORDER — METFORMIN HCL 500 MG PO TABS: 500.0000 mg | ORAL_TABLET | ORAL | Status: DC

## 2022-05-19 MED ORDER — SODIUM CHLORIDE 0.9 % IV SOLN
INTRAVENOUS | Status: AC
  Filled 2022-05-19: qty 2

## 2022-05-19 MED ORDER — METFORMIN HCL 500 MG PO TABS: 1000.0000 mg | ORAL_TABLET | Freq: Every day | ORAL | Status: DC

## 2022-05-19 MED ORDER — LORATADINE 10 MG PO TABS
10.0000 mg | ORAL_TABLET | Freq: Every day | ORAL | Status: DC
  Administered 2022-05-19: 10 mg via ORAL
  Filled 2022-05-19: qty 1

## 2022-05-19 MED ORDER — CEFAZOLIN SODIUM-DEXTROSE 1-4 GM/50ML-% IV SOLN
1.0000 g | Freq: Four times a day (QID) | INTRAVENOUS | Status: AC
  Administered 2022-05-19 – 2022-05-20 (×3): 1 g via INTRAVENOUS
  Filled 2022-05-19 (×3): qty 50

## 2022-05-19 MED ORDER — ONDANSETRON HCL 4 MG/2ML IJ SOLN: 4.0000 mg | Freq: Four times a day (QID) | INTRAMUSCULAR | Status: DC | PRN

## 2022-05-19 MED ORDER — FENTANYL CITRATE (PF) 100 MCG/2ML IJ SOLN
INTRAMUSCULAR | Status: AC
  Filled 2022-05-19: qty 2

## 2022-05-19 MED ORDER — CHLORHEXIDINE GLUCONATE 4 % EX LIQD: 4.0000 | Freq: Once | CUTANEOUS | Status: DC

## 2022-05-19 MED ORDER — LISINOPRIL 2.5 MG PO TABS
1.2500 mg | ORAL_TABLET | Freq: Two times a day (BID) | ORAL | Status: DC
  Administered 2022-05-19: 1.25 mg via ORAL
  Filled 2022-05-19 (×2): qty 0.5

## 2022-05-19 MED ORDER — LIDOCAINE HCL (PF) 1 % IJ SOLN
INTRAMUSCULAR | Status: DC | PRN
  Administered 2022-05-19: 60 mL

## 2022-05-19 MED ORDER — METOPROLOL SUCCINATE ER 25 MG PO TB24
25.0000 mg | ORAL_TABLET | Freq: Every day | ORAL | Status: DC
  Administered 2022-05-20: 25 mg via ORAL
  Filled 2022-05-19: qty 1

## 2022-05-19 MED ORDER — METFORMIN HCL 500 MG PO TABS
500.0000 mg | ORAL_TABLET | Freq: Two times a day (BID) | ORAL | Status: DC
  Administered 2022-05-20: 500 mg via ORAL
  Filled 2022-05-19: qty 1

## 2022-05-19 MED ORDER — LIDOCAINE HCL 1 % IJ SOLN
INTRAMUSCULAR | Status: AC
  Filled 2022-05-19: qty 20

## 2022-05-19 MED ORDER — POVIDONE-IODINE 10 % EX SWAB
2.0000 | Freq: Once | CUTANEOUS | Status: AC
  Administered 2022-05-19: 2 via TOPICAL

## 2022-05-19 MED ORDER — LIDOCAINE HCL (CARDIAC) PF 100 MG/5ML IV SOSY
PREFILLED_SYRINGE | INTRAVENOUS | Status: AC
  Filled 2022-05-19: qty 10

## 2022-05-19 MED ORDER — HEPARIN SODIUM (PORCINE) 1000 UNIT/ML IJ SOLN: INTRAMUSCULAR | Status: DC | PRN

## 2022-05-19 MED ORDER — HEPARIN (PORCINE) IN NACL 1000-0.9 UT/500ML-% IV SOLN
INTRAVENOUS | Status: DC | PRN
  Administered 2022-05-19: 500 mL

## 2022-05-19 MED ORDER — SODIUM CHLORIDE 0.9 % IV SOLN
80.0000 mg | INTRAVENOUS | Status: AC
  Administered 2022-05-19: 80 mg

## 2022-05-19 MED ORDER — CEFAZOLIN SODIUM-DEXTROSE 2-4 GM/100ML-% IV SOLN
INTRAVENOUS | Status: AC
  Filled 2022-05-19: qty 100

## 2022-05-19 MED ORDER — ACETAMINOPHEN 325 MG PO TABS
325.0000 mg | ORAL_TABLET | ORAL | Status: DC | PRN
  Administered 2022-05-19 – 2022-05-20 (×2): 650 mg via ORAL
  Filled 2022-05-19 (×2): qty 2

## 2022-05-19 SURGICAL SUPPLY — 18 items
CABLE SURGICAL S-101-97-12 (CABLE) ×1 IMPLANT
CATH CPS DIRECT SL2 DS2C003 (CATHETERS) IMPLANT
CATH CPS LOCATOR 3D MED (CATHETERS) IMPLANT
HELIX LOCKING TOOL (MISCELLANEOUS) ×1
LEAD QUARTET 1458Q-86CM (Lead) IMPLANT
LEAD ULTIPACE 65 LPA1231/65 (Lead) IMPLANT
PACEMAKER ALLR CRT-P RF PM3222 (Pacemaker) IMPLANT
PAD DEFIB RADIO PHYSIO CONN (PAD) ×1 IMPLANT
PPM ALLURE CRT-P RF PM3222 (Pacemaker) ×1 IMPLANT
SHEATH 9FR PRELUDE SNAP 13 (SHEATH) IMPLANT
SHEATH PROBE COVER 6X72 (BAG) IMPLANT
SLITTER AGILIS HISPRO (INSTRUMENTS) IMPLANT
TOOL HELIX LOCKING (MISCELLANEOUS) IMPLANT
TRAY PACEMAKER INSERTION (PACKS) ×1 IMPLANT
WIRE ACUITY WHISPER EDS 4648 (WIRE) IMPLANT
WIRE HI TORQ VERSACORE-J 145CM (WIRE) IMPLANT
WIRE MAILMAN 182CM (WIRE) IMPLANT
WIRE MAILMAN 300CM (WIRE) IMPLANT

## 2022-05-19 NOTE — Interval H&P Note (Signed)
History and Physical Interval Note:  05/19/2022 2:12 PM  Lauren Norton  has presented today for surgery, with the diagnosis of lead failure.  The various methods of treatment have been discussed with the patient and family. After consideration of risks, benefits and other options for treatment, the patient has consented to  Procedure(s): LEAD REVISION/REPAIR - PACEMAKER (N/A) as a surgical intervention.  The patient's history has been reviewed, patient examined, no change in status, stable for surgery.  I have reviewed the patient's chart and labs.  Questions were answered to the patient's satisfaction.     Anisah Kuck [employer]

## 2022-05-19 NOTE — Discharge Instructions (Signed)
     Supplemental Discharge Instructions for  Pacemaker/Defibrillator Patients    Activity No heavy lifting or vigorous activity with your left/right arm for 6 to 8 weeks.  Do not raise your left/right arm above your head for one week.  Gradually raise your affected arm as drawn below.             05/24/22                    05/25/22                    05/26/22                 05/27/22 __  NO DRIVING until cleared to at your wound check visit .  WOUND CARE Keep the wound area clean and dry.  Do not get this area wet , no showers until cleared to at your wound check visit . The tape/steri-strips on your wound will fall off; do not pull them off.  No bandage is needed on the site.  DO  NOT apply any creams, oils, or ointments to the wound area. If you notice any drainage or discharge from the wound, any swelling or bruising at the site, or you develop a fever > 101? F after you are discharged home, call the office at once.  Special Instructions You are still able to use cellular telephones; use the ear opposite the side where you have your pacemaker/defibrillator.  Avoid carrying your cellular phone near your device. When traveling through airports, show security personnel your identification card to avoid being screened in the metal detectors.  Ask the security personnel to use the hand wand. Avoid arc welding equipment, MRI testing (magnetic resonance imaging), TENS units (transcutaneous nerve stimulators).  Call the office for questions about other devices. Avoid electrical appliances that are in poor condition or are not properly grounded. Microwave ovens are safe to be near or to operate.

## 2022-05-20 ENCOUNTER — Ambulatory Visit (HOSPITAL_COMMUNITY): Payer: Medicare Other

## 2022-05-20 DIAGNOSIS — E1136 Type 2 diabetes mellitus with diabetic cataract: Secondary | ICD-10-CM | POA: Diagnosis not present

## 2022-05-20 DIAGNOSIS — E785 Hyperlipidemia, unspecified: Secondary | ICD-10-CM | POA: Insufficient documentation

## 2022-05-20 DIAGNOSIS — I5022 Chronic systolic (congestive) heart failure: Secondary | ICD-10-CM | POA: Diagnosis not present

## 2022-05-20 DIAGNOSIS — T82120A Displacement of cardiac electrode, initial encounter: Secondary | ICD-10-CM | POA: Diagnosis not present

## 2022-05-20 DIAGNOSIS — I5020 Unspecified systolic (congestive) heart failure: Secondary | ICD-10-CM | POA: Insufficient documentation

## 2022-05-20 DIAGNOSIS — I442 Atrioventricular block, complete: Secondary | ICD-10-CM | POA: Diagnosis not present

## 2022-05-20 DIAGNOSIS — E119 Type 2 diabetes mellitus without complications: Secondary | ICD-10-CM

## 2022-05-20 DIAGNOSIS — I110 Hypertensive heart disease with heart failure: Secondary | ICD-10-CM | POA: Diagnosis not present

## 2022-05-20 LAB — GLUCOSE, CAPILLARY: Glucose-Capillary: 154 mg/dL — ABNORMAL HIGH (ref 70–99)

## 2022-05-20 MED ORDER — ACETAMINOPHEN 325 MG PO TABS: 325.0000 mg | ORAL_TABLET | Freq: Four times a day (QID) | ORAL | Status: AC | PRN

## 2022-05-20 NOTE — Discharge Summary (Signed)
Discharge Summary    Patient ID: Lauren Norton MRN: [id number]; DOB: [birth year]  Admit date: 05/19/2022 Discharge date: 05/20/2022  PCP:  Prince Solian, [place] Providers Cardiologist:  None  Electrophysiologist:  Will Meredith Leeds, MD  {  Discharge Diagnoses    Principal Problem:   Complete AV block [facility]) Active Problems:   S/P placement of cardiac pacemaker   Hypertension   HFrEF (heart failure with reduced ejection fraction) ([place])   Hyperlipidemia   Type 2 diabetes mellitus without complication, with long-term current use of insulin ([place])    Diagnostic Studies/Procedures    Lead Revision/ Repair 05/19/2022: Conclusions:  1. Successful implantation of a Abbott allure RF dual-chamber pacemaker for symptomatic bradycardia  2. No early apparent complications.  _____________   History of Present Illness     Lauren Norton is a 76 y.o. female with with a history of complete heart block s/p PPM, HFrEF with mildly reduced EF of 45-50% on Echo in 03/2022, hypertension, hyperlipidemia, and  type 2 diabetes mellitus who is followed by Dr. Curt Bears.  Patient has a history of complete heart block and initially underwent placement of PPM in 02/2012. 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 Unfortunately, her coronary sinus lead subsequently dislodged and was noted to be in the os of the coronary sinus. She was recently seen by Dr. Curt Bears on 05/02/2022 and lead revision was discussed and arranged.  Hospital Course     Consultants: None   Patient presented to [facility] on 05/19/2022 for planned lead revision. She underwent a successful implantation of a Abbott allure RF dual-chamber pacemaker. She tolerated the procedure well with no apparent complications. She was admitted overnight for  observation. She is doing well on morning of discharge. Chest x-ray showed no evidence of pneumothorax and device interrogation showed stable lead position/ function. She was seen by Dr. Quentin Ore and felt to be stable for discharge. Will continue all home medications. Patient can use OTC Tylenol as needed for pain. Activity restrictions included in discharge summary. Outpatient follow-up has been arranged. Medications as below.   Did the patient have an acute coronary syndrome (MI, NSTEMI, STEMI, etc) this admission?:  No                               Did the patient have a percutaneous coronary intervention (stent / angioplasty)?:  No.    _____________  Discharge Vitals Blood pressure 100/79, pulse 72, temperature 98.2 F (36.8 C), temperature source Oral, resp. rate 13, height '5\' 6"'$  (1.676 m), SpO2 93 %.  There were no vitals filed for this visit.  Labs & Radiologic Studies    CBC No results for input(s): "WBC", "NEUTROABS", "HGB", "HCT", "MCV", "PLT" in the last 72 hours. Basic Metabolic Panel No results for input(s): "NA", "K", "CL", "CO2", "GLUCOSE", "BUN", "CREATININE", "CALCIUM", "MG", "PHOS" in the last 72 hours. Liver Function Tests No results for input(s): "AST", "ALT", "ALKPHOS", "BILITOT", "PROT", "ALBUMIN" in the last 72 hours. No results for input(s): "LIPASE", "AMYLASE" in the last 72 hours. High Sensitivity Troponin:   No results for input(s): "TROPONINIHS" in the last 720 hours.  BNP Invalid input(s): "POCBNP" D-Dimer No results for input(s): "DDIMER" in the last 72 hours. Hemoglobin A1C No results  for input(s): "HGBA1C" in the last 72 hours. Fasting Lipid Panel No results for input(s): "CHOL", "HDL", "LDLCALC", "TRIG", "CHOLHDL", "LDLDIRECT" in the last 72 hours. Thyroid Function Tests No results for input(s): "TSH", "T4TOTAL", "T3FREE", "THYROIDAB" in the last 72 hours.  Invalid input(s): "FREET3" _____________  DG Chest 2 View  Result Date:  05/20/2022 CLINICAL DATA:  Cardiac device in situ, other EXAM: CHEST - 2 VIEW COMPARISON:  04/19/2022. FINDINGS: Left anterior chest wall pacemaker. 3 total leads, right atrium, right ventricle and probably within/along the left ventricle. No pneumothorax. Cardiac silhouette is normal in size. No mediastinal or hilar masses. No evidence of adenopathy. Clear lungs.  No pleural effusion. Skeletal structures grossly intact. IMPRESSION: 1. Well-positioned left anterior chest wall pacemaker, leads as detailed. 2. No acute cardiopulmonary disease. Electronically Signed   By: Lajean Manes M.D.   On: 05/20/2022 08:44   EP PPM/ICD IMPLANT  Result Date: 05/19/2022 SURGEON:  Allegra Lai, MD   PREPROCEDURE DIAGNOSIS: Complete heart block   POSTPROCEDURE DIAGNOSIS: Complete heart block    PROCEDURES:  1.  Left bundle lead implant   INTRODUCTION:  Lauren Norton is a 76 y.o. female with a history of bradycardia who presents today for pacemaker implantation.  The patient reports intermittent episodes of dizziness over the past few months.  No reversible causes have been identified.  The patient therefore presents today for pacemaker implantation.   DESCRIPTION OF PROCEDURE:  Informed written consent was obtained, and  the patient was brought to the electrophysiology lab in a fasting state.  The patient required no sedation for the procedure today.  The patients left chest was prepped and draped in the usual sterile fashion by the EP lab staff. The skin overlying the left deltopectoral region was infiltrated with lidocaine for local analgesia.  A 4-cm incision was made over the left deltopectoral region.  A left subcutaneous pacemaker pocket was fashioned using a combination of sharp and blunt dissection. Electrocautery was required to assure hemostasis.  The prior pacemaker was removed from the pocket.  The coronary sinus lead was removed from the pacemaker.  The lead was cut away from the pectoralis fascia.  A mailman  wire was placed through the lead and into the coronary sinus.  The lead was backed off of the mailman wire. LV Lead Placement: A Abbott 135 guide was advanced through the left axillary vein into the low lateral right atrium. Coronary sinus cannulation was confirmed with electrogram recording from the hexapolar catheter. A selective coronary sinus venogram was performed by hand injection of nonionic contrast. This demonstrated a large CS body with very small/ atretic distal branches.  The Mannam wire was placed into a coronary sinus branch.  This is the same branch as the prior coronary sinus lead was placed.  A Abbott Quartet [id number] (serial number [id number]) lead was advanced through the guide into the lateral branch. This was approximately one-thirds from the base to the apex in a very lateral position.  In this location, there was significant diaphragmatic stim through all 4 poles of the lead.  The lead was pulled back and the coronary sinus catheter was advanced further into the coronary sinus.  There was a anterolateral branch.  A whisper wire was placed into the anterior lateral branch and the lead was advanced over the whisper wire.  When the whisper wire was removed from the lead, the lead dislodged into the coronary sinus.  It was thought that this was not a viable option.  Due to that, attention was paid to the left bundle lead. RV Lead Placement: I for scar wire was placed through the 135 guide.  The 135 guide was removed and the left bundle sheath was placed into the right ventricle.  Through the left axillary vein, an Abbott Ultipace 1231-65 (serial number [facility] [id number]) right ventricular lead was advanced with fluoroscopic visualization into the left bundle position.  Initial right ventricular lead R-waves measured paced mV with an impedance of 750 ohms and a threshold of 0.75 V at 0.5 msec.  Both leads were secured to the pectoralis fascia using #2-0 silk over the suture sleeves. Device Placement:  The  leads were then connected to an [place] RF [id number] (serial number [id number]) pacemaker.  The pocket was irrigated with copious gentamicin solution.  The pacemaker was then placed into the pocket.  The pocket was then closed in 3 layers with 2.0 Vicryl suture for the 3.0 Vicryl suture subcutaneous and subcuticular layers.  Steri-  Strips and a sterile dressing were then applied. EBL<1m.  There were no early apparent complications.   CONCLUSIONS:  1. Successful implantation of a Abbott allure RF dual-chamber pacemaker for symptomatic bradycardia  2. No early apparent complications.       Will CCurt Bears MD 05/19/2022 5:58 PM   Disposition   Pt is being discharged home today in good condition.  Follow-up Plans & Appointments     [facility] [place]   Specialty: Cardiology Why: Wound check appointment scheduled fro 06/01/2022 at 2:40pm. Contact information: [address] [phone]       CConstance Haw MD Follow up.   Specialty: Cardiology Why: Follow-up visit with Dr. CCurt Bearsscheduled for 08/25/2022 at 11:30am. Contact information: [place] [phone]               Discharge Instructions     Diet - low sodium heart healthy   Complete by: As directed    Increase activity slowly   Complete by: As directed         Discharge Medications   Allergies as of 05/20/2022       Reactions   Sulfa Antibiotics Itching   (Eye drops)        Medication List     STOP taking these medications    ibuprofen 200 MG tablet Commonly known as: ADVIL       TAKE these medications    acetaminophen 325 MG tablet Commonly known as: TYLENOL Take 1-2 tablets (325-650 mg total) by mouth every 6 (six) hours as needed for mild pain or moderate pain.   [place] WILD FISH OIL  PO Take 1 capsule by mouth 3 (three) times a week.   alendronate 70 MG tablet Commonly known as: FOSAMAX Take 70 mg by mouth every Tuesday.   atorvastatin 10 MG tablet Commonly known as: LIPITOR Take 10 mg by mouth daily with supper.   AZO Bladder Control/Go-Less Caps Take 1 tablet by mouth in the morning and at bedtime.   Biotin 5000 MCG Tabs Take 5,000 mcg by mouth daily with lunch.   CINNAMON PO Take 1 capsule by mouth in the morning and at bedtime. CinSulin 1.25   CITRACAL +D3 PO Take 1 tablet by mouth in the morning and at bedtime.   COLLAGEN ULTRA PO Take  10 mLs by mouth daily. Zena Liquid Collagen + Biotin   lisinopril 2.5 MG tablet Commonly known as: ZESTRIL Take 1.25 mg by mouth 2 (two) times daily.   loratadine 10 MG tablet Commonly known as: CLARITIN Take 10 mg by mouth at bedtime.   metFORMIN 500 MG tablet Commonly known as: GLUCOPHAGE Take 500-1,000 mg by mouth See admin instructions. Take '500mg'$  by mouth in the morning, '500mg'$  at lunch and '1000mg'$  at supper time   metoprolol succinate 25 MG 24 hr tablet Commonly known as: TOPROL-XL Take 1 tablet (25 mg total) by mouth daily.   MOVE FREE PO Take 1 tablet by mouth in the morning and at bedtime.   multivitamin with minerals Tabs tablet Take 1 tablet by mouth in the morning.   polyethylene glycol powder 17 GM/SCOOP powder Commonly known as: GLYCOLAX/MIRALAX Take 17 g by mouth daily as needed for moderate constipation.   sodium chloride 2 % ophthalmic solution Commonly known as: MURO 128 Place 1 drop into both eyes daily.   tretinoin 0.05 % cream Commonly known as: RETIN-A Apply 1 Application topically every other day. At night           Outstanding Labs/Studies   N/A  Duration of Discharge Encounter   Greater than 30 minutes including physician time.  Signed, Darreld Mclean, PA-C 05/20/2022, 10:08 AM

## 2022-05-20 NOTE — Progress Notes (Signed)
Rounding Note    Patient Name: Lauren Norton Lauren Norton Date of Encounter: 05/20/2022  [place] Cardiologist: None   Subjective   Doing well after PPM implant yesterday.  Inpatient Medications    Scheduled Meds:  atorvastatin  10 mg Oral Q supper   lisinopril  1.25 mg Oral BID   loratadine  10 mg Oral QHS   metFORMIN  500 mg Oral BID WC   And   metFORMIN  1,000 mg Oral Q supper   metoprolol succinate  25 mg Oral Daily   Continuous Infusions:   ceFAZolin (ANCEF) IV 1 g (05/20/22 0907)   PRN Meds: acetaminophen, ondansetron (ZOFRAN) IV   Vital Signs    Vitals:   05/19/22 2323 05/20/22 0348 05/20/22 0723 05/20/22 0903  BP: 128/84 128/76 95/69 100/79  Pulse: 71 68 76 72  Resp: '16 14 13   '$ Temp: 98.3 F (36.8 C) 98.1 F (36.7 C) 98.2 F (36.8 C)   TempSrc: Oral Oral Oral   SpO2: 95% 96% 93%   Height:        Intake/Output Summary (Last 24 hours) at 05/20/2022 0930 Last data filed at 05/20/2022 0554 Gross per 24 hour  Intake 290.5 ml  Output --  Net 290.5 ml      05/12/2022    9:35 AM 05/02/2022    8:58 AM 04/04/2022    2:19 PM  Last 3 Weights  Weight (lbs) 146 lb 146 lb 144 lb  Weight (kg) 66.225 kg 66.225 kg 65.318 kg      Telemetry    Personally Reviewed  ECG    Personally Reviewed  Physical Exam   GEN: No acute distress.   Neck: No JVD Cardiac: RRR, no murmurs, rubs, or gallops. PPM pocket healing well. Respiratory: Clear to auscultation bilaterally. GI: Soft, nontender, non-distended  MS: No edema; No deformity. Neuro:  Nonfocal  Psych: Normal affect   Labs    High Sensitivity Troponin:  No results for input(s): "TROPONINIHS" in the last 720 hours.   ChemistryNo results for input(s): "NA", "K", "CL", "CO2", "GLUCOSE", "BUN", "CREATININE", "CALCIUM", "MG", "PROT", "ALBUMIN", "AST", "ALT", "ALKPHOS", "BILITOT", "GFRNONAA", "GFRAA", "ANIONGAP" in the last 168 hours.  Lipids No results for input(s): "CHOL", "TRIG", "HDL", "LABVLDL",  "LDLCALC", "CHOLHDL" in the last 168 hours.  HematologyNo results for input(s): "WBC", "RBC", "HGB", "HCT", "MCV", "MCH", "MCHC", "RDW", "PLT" in the last 168 hours. Thyroid No results for input(s): "TSH", "FREET4" in the last 168 hours.  BNPNo results for input(s): "BNP", "PROBNP" in the last 168 hours.  DDimer No results for input(s): "DDIMER" in the last 168 hours.   Radiology    DG Chest 2 View  Result Date: 05/20/2022 CLINICAL DATA:  Cardiac device in situ, other EXAM: CHEST - 2 VIEW COMPARISON:  04/19/2022. FINDINGS: Left anterior chest wall pacemaker. 3 total leads, right atrium, right ventricle and probably within/along the left ventricle. No pneumothorax. Cardiac silhouette is normal in size. No mediastinal or hilar masses. No evidence of adenopathy. Clear lungs.  No pleural effusion. Skeletal structures grossly intact. IMPRESSION: 1. Well-positioned left anterior chest wall pacemaker, leads as detailed. 2. No acute cardiopulmonary disease. Electronically Signed   By: Lauren Manes M.D.   On: 05/20/2022 08:44   EP PPM/ICD IMPLANT  Result Date: 05/19/2022 SURGEON:  Lauren Norton   PREPROCEDURE DIAGNOSIS: Complete heart block   POSTPROCEDURE DIAGNOSIS: Complete heart block    PROCEDURES:  1.  Left bundle lead implant   INTRODUCTION:  Lauren Norton Lauren Norton  is a 76 y.o. female with a history of bradycardia who presents today for pacemaker implantation.  The patient reports intermittent episodes of dizziness over the past few months.  No reversible causes have been identified.  The patient therefore presents today for pacemaker implantation.   DESCRIPTION OF PROCEDURE:  Informed written consent was obtained, and  the patient was brought to the electrophysiology lab in a fasting state.  The patient required no sedation for the procedure today.  The patients left chest was prepped and draped in the usual sterile fashion by the EP lab staff. The skin overlying the left deltopectoral region was  infiltrated with lidocaine for local analgesia.  A 4-cm incision was made over the left deltopectoral region.  A left subcutaneous pacemaker pocket was fashioned using a combination of sharp and blunt dissection. Electrocautery was required to assure hemostasis.  The prior pacemaker was removed from the pocket.  The coronary sinus lead was removed from the pacemaker.  The lead was cut away from the pectoralis fascia.  A Lauren Norton was placed through the lead and into the coronary sinus.  The lead was backed off of the Lauren Norton. LV Lead Placement: A Lauren Norton guide was advanced through the left axillary vein into the low lateral right atrium. Coronary sinus cannulation was confirmed with electrogram recording from the hexapolar catheter. A selective coronary sinus venogram was performed by hand injection of nonionic contrast. This demonstrated a large CS body with very small/ atretic distal branches.  The Lauren Norton was placed into a coronary sinus branch.  This is the same branch as the prior coronary sinus lead was placed.  A Lauren Norton (serial number [id number]) lead was advanced through the guide into the lateral branch. This was approximately one-thirds from the base to the apex in a very lateral position.  In this location, there was significant diaphragmatic stim through all 4 poles of the lead.  The lead was pulled back and the coronary sinus catheter was advanced further into the coronary sinus.  There was a anterolateral branch.  A whisper Norton was placed into the anterior lateral branch and the lead was advanced over the whisper Norton.  When the whisper Norton was removed from the lead, the lead dislodged into the coronary sinus.  It was thought that this was not a viable option.  Due to that, attention was paid to the left bundle lead. RV Lead Placement: I for scar Norton was placed through the Norton guide.  The Norton guide was removed and the left bundle sheath was placed into the right  ventricle.  Through the left axillary vein, an Lauren Norton 1231-65 (serial number Lauren Norton [id number]) right ventricular lead was advanced with fluoroscopic visualization into the left bundle position.  Initial right ventricular lead R-waves measured paced mV with an impedance of 750 ohms and a threshold of 0.75 V at 0.5 msec.  Both leads were secured to the pectoralis fascia using #2-0 silk over the suture sleeves. Device Placement:  The leads were then connected to an [place] RF [id number] (serial number [id number]) pacemaker.  The pocket was irrigated with copious gentamicin solution.  The pacemaker was then placed into the pocket.  The pocket was then closed in 3 layers with 2.0 Vicryl suture for the 3.0 Vicryl suture subcutaneous and subcuticular layers.  Steri-  Strips and a sterile dressing were then applied. EBL<32m.  There were no early apparent complications.   CONCLUSIONS:  1. Successful implantation of a  Lauren Norton allure RF dual-chamber pacemaker for symptomatic bradycardia  2. No early apparent complications.       Will Curt Bears, Norton 05/19/2022 5:58 PM      Assessment & Plan    #CHB Doing well after PPM implant yesterday. CXR and Device interrogation show stable lead position/function. OK to discharge.  For questions or updates, please contact [place] Please consult [email or web address] for contact info under        Signed, Vickie Epley, Norton  05/20/2022, 9:30 AM

## 2022-05-20 NOTE — Plan of Care (Signed)
°  Problem: Clinical Measurements: °Goal: Respiratory complications will improve °Outcome: Progressing °Goal: Cardiovascular complication will be avoided °Outcome: Progressing °  °Problem: Activity: °Goal: Risk for activity intolerance will decrease °Outcome: Progressing °  °

## 2022-05-20 NOTE — Plan of Care (Signed)

## 2022-05-22 ENCOUNTER — Encounter (HOSPITAL_COMMUNITY): Payer: Self-pay | Admitting: Cardiology

## 2022-05-23 ENCOUNTER — Encounter (HOSPITAL_COMMUNITY): Payer: Self-pay | Admitting: Cardiology

## 2022-05-24 MED FILL — Lidocaine HCl Local Soln Prefilled Syringe 100 MG/5ML (2%): INTRAMUSCULAR | Qty: 5 | Status: AC

## 2022-05-30 ENCOUNTER — Ambulatory Visit: Payer: Medicare Other | Attending: Cardiology

## 2022-05-30 DIAGNOSIS — I442 Atrioventricular block, complete: Secondary | ICD-10-CM

## 2022-05-30 NOTE — Patient Instructions (Addendum)
   After Your Pacemaker   Monitor your pacemaker site for redness, swelling, and drainage. Call the device clinic at [phone] if you experience these symptoms or fever/chills.  Your incision was closed with Steri-strips or staples:  You may shower 7 days after your procedure and wash your incision with soap and water. Avoid lotions, ointments, or perfumes over your incision until it is well-healed.  You may use a hot tub or a pool after your wound check appointment if the incision is completely closed.  Do not lift, push or pull greater than 10 pounds with the affected arm until 6 weeks after your procedure. There are no other restrictions in arm movement after your wound check appointment. Until After January 19th.  You may drive, unless driving has been restricted by your healthcare providers. .  Remote monitoring is used to monitor your pacemaker from home. This monitoring is scheduled every 91 days by our office. It allows [place] to keep an eye on the functioning of your device to ensure it is working properly. You will routinely see your Electrophysiologist annually (more often if necessary).

## 2022-05-31 LAB — CUP PACEART INCLINIC DEVICE CHECK
Battery Remaining Longevity: 102 mo
Battery Voltage: 3.02 V
Brady Statistic RA Percent Paced: 14 %
Brady Statistic RV Percent Paced: 99.92 %
Date Time Interrogation Session: 20231219152800
Implantable Lead Connection Status: 753985
Implantable Lead Connection Status: 753985
Implantable Lead Connection Status: 753985
Implantable Lead Implant Date: 20130917
Implantable Lead Implant Date: 20130917
Implantable Lead Implant Date: 20231208
Implantable Lead Location: 753858
Implantable Lead Location: 753859
Implantable Lead Location: 753860
Implantable Lead Model: 1944
Implantable Lead Model: 1948
Implantable Pulse Generator Implant Date: 20231208
Lead Channel Impedance Value: 587.5 Ohm
Lead Channel Impedance Value: 650 Ohm
Lead Channel Impedance Value: 700 Ohm
Lead Channel Pacing Threshold Amplitude: 0.5 V
Lead Channel Pacing Threshold Amplitude: 0.5 V
Lead Channel Pacing Threshold Amplitude: 0.75 V
Lead Channel Pacing Threshold Amplitude: 0.75 V
Lead Channel Pacing Threshold Amplitude: 1 V
Lead Channel Pacing Threshold Amplitude: 1 V
Lead Channel Pacing Threshold Pulse Width: 0.5 ms
Lead Channel Pacing Threshold Pulse Width: 0.5 ms
Lead Channel Pacing Threshold Pulse Width: 0.5 ms
Lead Channel Pacing Threshold Pulse Width: 0.5 ms
Lead Channel Pacing Threshold Pulse Width: 0.5 ms
Lead Channel Pacing Threshold Pulse Width: 0.5 ms
Lead Channel Sensing Intrinsic Amplitude: 2.5 mV
Lead Channel Setting Pacing Amplitude: 2 V
Lead Channel Setting Pacing Amplitude: 2 V
Lead Channel Setting Pacing Amplitude: 2 V
Lead Channel Setting Pacing Pulse Width: 0.5 ms
Lead Channel Setting Pacing Pulse Width: 0.5 ms
Lead Channel Setting Sensing Sensitivity: 4 mV
Pulse Gen Model: 3222
Pulse Gen Serial Number: 3997337

## 2022-05-31 NOTE — Progress Notes (Signed)
CRT-P device check in clinic/Wound check appointment. Steri-strips removed. Wound without redness or edema. Incision edges approximated, wound well healed. Normal device function. Thresholds, sensing, and impedances consistent with implant measurements. Device programmed at 3.5V/auto capture programmed on for extra safety margin until 3 month visit. Histogram distribution appropriate for patient and level of activity. No mode switches or high ventricular rates noted. Patient educated about wound care, arm mobility, lifting restrictions. ROV in 3 months with implanting physician. Patient bi-ventricularly pacing 99% of the time. Device heart failure diagnostics are within normal limits. Patient enrolled in remote follow-up.  Education given to patient regarding lift/push/pull restrictions.  Patient verbalizes understanding and reviewed all of patient questions.  Will see her back in 32mhs with Dr. CCurt Bears

## 2022-06-01 ENCOUNTER — Ambulatory Visit: Payer: Medicare Other

## 2022-06-27 ENCOUNTER — Ambulatory Visit: Payer: Medicare Other | Admitting: Gastroenterology

## 2022-06-27 ENCOUNTER — Encounter: Payer: Self-pay | Admitting: Gastroenterology

## 2022-06-27 VITALS — BP 122/82 | HR 86 | Ht 66.0 in | Wt 146.5 lb

## 2022-06-27 DIAGNOSIS — K641 Second degree hemorrhoids: Secondary | ICD-10-CM | POA: Diagnosis not present

## 2022-06-27 NOTE — Progress Notes (Signed)
Chief Complaint:    Symptomatic Internal Hemorrhoids; Hemorrhoid Band Ligation  GI History: Lauren Norton is a 77 y.o. 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     History of chronic constipation, which is generally well controlled with high-fiber diet and MiraLAX.  Uses Miralax prn. Constipation since childhood.    History of hemorrhoids, which she has historically treated with topical hydrocortisone prn. More relief with A+D ointment.  Has had hemorrhoidal sxs for 10+ years. Worse with exacerbation of constipation. Index sxs of itching, irritation, seepage/incomplete cleaning ; rare scant BRBPR.   Was seen in 2016 for hemorrhoidal sxs and constipation. Colonoscopy at that time with a single benign polyp and internal hemorrhoids.   - 03/17/2022: Banding of RP hemorrhoid - 05/12/2022: Banding of LL hemorrhoid     Endoscopic History: - Colonoscopy in [place] in the early 2000's which was reportedly normal/no polyps - Colonoscopy (10/30/2014): 3 mm descending colon polyp (path: Benign lymphoid aggregate), Internal hemorrhoids.  Repeat in 10 years  HPI:     Patient is a 77 y.o. femalewith a history of symptomatic internal hemorrhoids presenting to the Gastroenterology Clinic for follow-up and ongoing treatment. The patient presents with symptomatic grade 2 hemorrhoids, unresponsive to maximal medical therapy, requesting rubber band ligation of symptomatic hemorrhoidal disease.  Since last appointment has had pacemaker exchanged.  Otherwise, hemorrhoidal symptoms much improved since starting hemorrhoid banding series.  Presents today for hemorrhoid band #3.   Review of systems:     No chest pain, no SOB, no fevers, no urinary sx   Past Medical History:  Diagnosis Date   Anxiety    Bronchitis 2018   Cataract 2017   both eyes   Diabetes ([place])    HLD  (hyperlipidemia)    HTN (hypertension)    Osteoarthritis of both hips    both hips replaced   Pneumonia    many years ago   Presence of permanent cardiac pacemaker    2013   Second degree AV block    a.s/p MDT dual chamber PPM    Shingles    Skin cancer ~ 2007   "back"    Patient's surgical history, family medical history, social history, medications and allergies were all reviewed in Epic    Current Outpatient Medications  Medication Sig Dispense Refill   acetaminophen (TYLENOL) 325 MG tablet Take 1-2 tablets (325-650 mg total) by mouth every 6 (six) hours as needed for mild pain or moderate pain.     alendronate (FOSAMAX) 70 MG tablet Take 70 mg by mouth every Tuesday.     atorvastatin (LIPITOR) 10 MG tablet Take 10 mg by mouth daily with supper.      Biotin 5000 MCG TABS Take 5,000 mcg by mouth daily with lunch.     Calcium-Phosphorus-Vitamin D (CITRACAL +D3 PO) Take 1 tablet by mouth in the morning and at bedtime.     CINNAMON PO Take 1 capsule by mouth in the morning and at bedtime. CinSulin 1.25     Glucosamine-Chondroitin (MOVE FREE PO) Take 1 tablet by mouth in the morning and at bedtime.     lisinopril (ZESTRIL) 2.5 MG tablet Take 1.25 mg by mouth 2 (two) times daily.     loratadine (CLARITIN) 10 MG tablet Take 10 mg by mouth at bedtime.     metFORMIN (GLUCOPHAGE) 500 MG tablet Take 500-1,000 mg by mouth  See admin instructions. Take '500mg'$  by mouth in the morning, '500mg'$  at lunch and '1000mg'$  at supper time     metoprolol succinate (TOPROL-XL) 25 MG 24 hr tablet Take 1 tablet (25 mg total) by mouth daily. 90 tablet 1   Multiple Vitamin (MULTIVITAMIN WITH MINERALS) TABS tablet Take 1 tablet by mouth in the morning.     Omega-3 Fatty Acids ([place] WILD FISH OIL PO) Take 1 capsule by mouth 3 (three) times a week.     polyethylene glycol powder (GLYCOLAX/MIRALAX) powder Take 17 g by mouth daily as needed for moderate constipation.     Pumpkin Seed-Soy Germ (AZO BLADDER  CONTROL/GO-LESS) CAPS Take 1 tablet by mouth in the morning and at bedtime.     sodium chloride (MURO 128) 2 % ophthalmic solution Place 1 drop into both eyes daily.     Specialty Vitamins Products (COLLAGEN ULTRA PO) Take 10 mLs by mouth daily. Zena Liquid Collagen + Biotin     tretinoin (RETIN-A) 0.05 % cream Apply 1 Application topically every other day. At night     No current facility-administered medications for this visit.    Physical Exam:     BP 122/82   Pulse 86   Ht '5\' 6"'$  (1.676 m)   Wt 146 lb 8 oz (66.5 kg)   BMI 23.65 kg/m   GENERAL:  Pleasant female in NAD PSYCH: : Cooperative, normal affect Rectal exam: Sensation intact and preserved anal wink.  Grade 1 hemorrhoid noted in RA position on exam.  No external anal fissures noted. Normal sphincter tone. No palpable mass. No blood on the exam glove. (Chaperone: Renee Rival, CMA).   IMPRESSION and PLAN:    #1.  Symptomatic internal hemorrhoids:  PROCEDURE NOTE: The patient presents with symptomatic grade 2 hemorrhoids, unresponsive to maximal medical therapy, requesting rubber band ligation of symptomatic hemorrhoidal disease.  All risks, benefits and alternative forms of therapy were described and informed consent was obtained.  In the Left Lateral Decubitus position, anoscopic examination revealed grade 1 hemorrhoids in the RA position(s).  The anorectum was pre-medicated with RectiCare. The decision was made to band the RA internal hemorrhoid, and the [place] was used to perform band ligation without complication.  Digital anorectal examination was then performed to assure proper positioning of the band, and to adjust the banded tissue as required.  The patient was discharged home without pain or other issues.  Dietary and behavioral recommendations were given and along with follow-up instructions.     The following adjunctive treatments were recommended:  -Resume high-fiber diet with fiber supplement  (i.e. Citrucel or Benefiber) with goal for soft stools without straining to have a BM. -Resume adequate fluid intake.  The patient will return as needed for follow-up and additional banding if return of hemorrhoidal symptoms.   No complications were encountered and the patient tolerated the procedure well.      #2.  Chronic constipation -Continue high-fiber diet with MiraLAX prn - Continue adequate hydration      Lavena Bullion ,DO, FACG 06/27/2022, 11:54 AM

## 2022-06-27 NOTE — Patient Instructions (Addendum)
   HEMORRHOID BANDING PROCEDURE    FOLLOW-UP CARE   The procedure you have had should have been relatively painless since the banding of the area involved does not have nerve endings and there is no pain sensation.  The rubber band cuts off the blood supply to the hemorrhoid and the band may fall off as soon as 48 hours after the banding (the band may occasionally be seen in the toilet bowl following a bowel movement). You may notice a temporary feeling of fullness in the rectum which should respond adequately to plain Tylenol or Motrin.  Following the banding, avoid strenuous exercise that evening and resume full activity the next day.  A sitz bath (soaking in a warm tub) or bidet is soothing, and can be useful for cleansing the area after bowel movements.     To avoid constipation, take two tablespoons of natural wheat bran, natural oat bran, flax, Benefiber or any over the counter fiber supplement and increase your water intake to 7-8 glasses daily.    Unless you have been prescribed anorectal medication, do not put anything inside your rectum for two weeks: No suppositories, enemas, fingers, etc.  Occasionally, you may have more bleeding than usual after the banding procedure.  This is often from the untreated hemorrhoids rather than the treated one.  Don't be concerned if there is a tablespoon or so of blood.  If there is more blood than this, lie flat with your bottom higher than your head and apply an ice pack to the area. If the bleeding does not stop within a half an hour or if you feel faint, call our office at [phone] or go to the emergency room.  Problems are not common; however, if there is a substantial amount of bleeding, severe pain, chills, fever or difficulty passing urine (very rare) or other problems, you should call us at [phone] or report to the nearest emergency room.  Do not stay seated continuously for more than 2-3 hours for a day or two after the  procedure.  Tighten your buttock muscles 10-15 times every two hours and take 10-15 deep breaths every 1-2 hours.  Do not spend more than a few minutes on the toilet if you cannot empty your bowel; instead re-visit the toilet at a later time.    Due to recent changes in healthcare laws, you may see the results of your imaging and laboratory studies on MyChart before your provider has had a chance to review them.  We understand that in some cases there may be results that are confusing or concerning to you. Not all laboratory results come back in the same time frame and the provider may be waiting for multiple results in order to interpret others.  Please give [place] 48 hours in order for your provider to thoroughly review all the results before contacting the office for clarification of your results.   Follow up as needed.   Thank you for choosing me and [facility].  Vito Cirigliano, D.O.

## 2022-07-10 ENCOUNTER — Encounter: Payer: Medicare Other | Admitting: Cardiology

## 2022-08-09 ENCOUNTER — Other Ambulatory Visit: Payer: Self-pay | Admitting: Physician Assistant

## 2022-08-16 ENCOUNTER — Ambulatory Visit: Payer: Medicare Other

## 2022-08-16 DIAGNOSIS — I442 Atrioventricular block, complete: Secondary | ICD-10-CM

## 2022-08-17 LAB — CUP PACEART REMOTE DEVICE CHECK
Battery Remaining Longevity: 94 mo
Battery Remaining Percentage: 94 %
Battery Voltage: 2.99 V
Brady Statistic AP VP Percent: 16 %
Brady Statistic AP VS Percent: 1 %
Brady Statistic AS VP Percent: 83 %
Brady Statistic AS VS Percent: 1 %
Brady Statistic RA Percent Paced: 16 %
Date Time Interrogation Session: 20240307020014
Implantable Lead Connection Status: 753985
Implantable Lead Connection Status: 753985
Implantable Lead Connection Status: 753985
Implantable Lead Implant Date: 20130917
Implantable Lead Implant Date: 20130917
Implantable Lead Implant Date: 20231208
Implantable Lead Location: 753858
Implantable Lead Location: 753859
Implantable Lead Location: 753860
Implantable Lead Model: 1944
Implantable Lead Model: 1948
Implantable Pulse Generator Implant Date: 20231208
Lead Channel Impedance Value: 550 Ohm
Lead Channel Impedance Value: 580 Ohm
Lead Channel Impedance Value: 650 Ohm
Lead Channel Pacing Threshold Amplitude: 0.625 V
Lead Channel Pacing Threshold Amplitude: 0.75 V
Lead Channel Pacing Threshold Amplitude: 0.875 V
Lead Channel Pacing Threshold Pulse Width: 0.5 ms
Lead Channel Pacing Threshold Pulse Width: 0.5 ms
Lead Channel Pacing Threshold Pulse Width: 0.5 ms
Lead Channel Sensing Intrinsic Amplitude: 1.6 mV
Lead Channel Sensing Intrinsic Amplitude: 5.8 mV
Lead Channel Setting Pacing Amplitude: 2 V
Lead Channel Setting Pacing Amplitude: 2 V
Lead Channel Setting Pacing Amplitude: 2 V
Lead Channel Setting Pacing Pulse Width: 0.5 ms
Lead Channel Setting Pacing Pulse Width: 0.5 ms
Lead Channel Setting Sensing Sensitivity: 4 mV
Pulse Gen Model: 3222
Pulse Gen Serial Number: 3997337

## 2022-08-25 ENCOUNTER — Encounter: Payer: Self-pay | Admitting: Cardiology

## 2022-08-25 ENCOUNTER — Ambulatory Visit: Payer: Medicare Other | Attending: Cardiology | Admitting: Cardiology

## 2022-08-25 VITALS — BP 114/70 | HR 73 | Ht 66.0 in | Wt 146.0 lb

## 2022-08-25 DIAGNOSIS — I5022 Chronic systolic (congestive) heart failure: Secondary | ICD-10-CM | POA: Diagnosis not present

## 2022-08-25 DIAGNOSIS — I442 Atrioventricular block, complete: Secondary | ICD-10-CM

## 2022-08-25 DIAGNOSIS — I10 Essential (primary) hypertension: Secondary | ICD-10-CM | POA: Diagnosis not present

## 2022-08-25 LAB — CUP PACEART INCLINIC DEVICE CHECK
Battery Remaining Longevity: 96 mo
Battery Voltage: 2.99 V
Brady Statistic RA Percent Paced: 17 %
Brady Statistic RV Percent Paced: 99.94 %
Date Time Interrogation Session: 20240315121229
Implantable Lead Connection Status: 753985
Implantable Lead Connection Status: 753985
Implantable Lead Connection Status: 753985
Implantable Lead Implant Date: 20130917
Implantable Lead Implant Date: 20130917
Implantable Lead Implant Date: 20231208
Implantable Lead Location: 753858
Implantable Lead Location: 753859
Implantable Lead Location: 753860
Implantable Lead Model: 1944
Implantable Lead Model: 1948
Implantable Pulse Generator Implant Date: 20231208
Lead Channel Impedance Value: 600 Ohm
Lead Channel Impedance Value: 662.5 Ohm
Lead Channel Impedance Value: 700 Ohm
Lead Channel Pacing Threshold Amplitude: 0.5 V
Lead Channel Pacing Threshold Amplitude: 0.5 V
Lead Channel Pacing Threshold Amplitude: 0.75 V
Lead Channel Pacing Threshold Amplitude: 0.75 V
Lead Channel Pacing Threshold Amplitude: 1 V
Lead Channel Pacing Threshold Amplitude: 1 V
Lead Channel Pacing Threshold Pulse Width: 0.5 ms
Lead Channel Pacing Threshold Pulse Width: 0.5 ms
Lead Channel Pacing Threshold Pulse Width: 0.5 ms
Lead Channel Pacing Threshold Pulse Width: 0.5 ms
Lead Channel Pacing Threshold Pulse Width: 0.5 ms
Lead Channel Pacing Threshold Pulse Width: 0.5 ms
Lead Channel Sensing Intrinsic Amplitude: 2.1 mV
Lead Channel Setting Pacing Amplitude: 2 V
Lead Channel Setting Pacing Amplitude: 2 V
Lead Channel Setting Pacing Amplitude: 2 V
Lead Channel Setting Pacing Pulse Width: 0.5 ms
Lead Channel Setting Pacing Pulse Width: 0.5 ms
Lead Channel Setting Sensing Sensitivity: 4 mV
Pulse Gen Model: 3222
Pulse Gen Serial Number: 3997337

## 2022-08-25 NOTE — Patient Instructions (Signed)
Medication Instructions:  Your physician recommends that you continue on your current medications as directed. Please refer to the Current Medication list given to you today.  *If you need a refill on your cardiac medications before your next appointment, please call your pharmacy*   Lab Work: None ordered   Testing/Procedures: None ordered   Follow-Up: At [facility], you and your health needs are our priority.  As part of our continuing mission to provide you with exceptional heart care, we have created designated Provider Care Teams.  These Care Teams include your primary Cardiologist (physician) and Advanced Practice Providers (APPs -  Physician Assistants and Nurse Practitioners) who all work together to provide you with the care you need, when you need it.  Your next appointment:   6 month(s)  The format for your next appointment:   In Person  Provider:   Renee Ursuy, PA-C    Thank you for choosing [facility]!!   Keith Felten, RN [phone]     

## 2022-08-25 NOTE — Progress Notes (Signed)
Electrophysiology Office Note   Date:  08/25/2022   ID:  Lauren Norton, DOB 1945-07-24, MRN [id number]  PCP:  Prince Solian, MD  Cardiologist:   Primary Electrophysiologist:  Daxter Paule Meredith Leeds, MD    Chief Complaint: heart block   History of Present Illness: Lauren Norton is a 77 y.o. female who is being seen today for the evaluation of heart block at the request of Avva, Ravisankar, MD. Presenting today for electrophysiology evaluation.  She has a history significant for hyperlipidemia, diabetes, complete heart block.  She is status post Abbott dual-chamber pacemaker.  She had generator change with device upgrade 04/04/2022.  She had dislodgment of her coronary sinus lead and is now status post lead revision with implantation of a left bundle area pacing lead.  Today, denies symptoms of palpitations, chest pain, shortness of breath, orthopnea, PND, lower extremity edema, claudication, dizziness, presyncope, syncope, bleeding, or neurologic sequela. The patient is tolerating medications without difficulties.  Since being seen she has done well.  She has had no chest pain or shortness of breath.  She has been able to do all of her daily activities.  She feels that she has been improving since her device upgrade.  No acute complaints.   Past Medical History:  Diagnosis Date   Anxiety    Bronchitis 2018   Cataract 2017   both eyes   Diabetes ([place])    HLD (hyperlipidemia)    HTN (hypertension)    Osteoarthritis of both hips    both hips replaced   Pneumonia    many years ago   Presence of permanent cardiac pacemaker    2013   Second degree AV block    a.s/p MDT dual chamber PPM    Shingles    Skin cancer ~ 2007   "back"   Past Surgical History:  Procedure Laterality Date   ANTERIOR AND POSTERIOR REPAIR N/A 01/29/2018   Procedure: ANTERIOR (CYSTOCELE) AND POSTERIOR REPAIR (RECTOCELE);  Surgeon: Paula Compton, MD;  Location: [facility];  Service: Gynecology;   Laterality: N/A;   BIV UPGRADE N/A 04/04/2022   Procedure: BIV UPGRADE;  Surgeon: Constance Haw, MD;  Location: [facility];  Service: Cardiovascular;  Laterality: N/A;   CATARACT EXTRACTION, BILATERAL  2017   CYST REMOVAL NECK  2016   CYSTOSCOPY N/A 01/29/2018   Procedure: CYSTOSCOPY;  Surgeon: Paula Compton, MD;  Location: [facility];  Service: Gynecology;  Laterality: N/A;   defribrillator     JOINT REPLACEMENT Bilateral    bilateral hip replacements   LEAD REVISION/REPAIR N/A 05/19/2022   Procedure: LEAD REVISION/REPAIR - PACEMAKER;  Surgeon: Constance Haw, MD;  Location: [facility];  Service: Cardiovascular;  Laterality: N/A;   left elbow fracture     age 47   PERMANENT PACEMAKER INSERTION N/A 02/27/2012   a. MDT dual chamber PPM implanted by Dr Rayann Heman for Mobitz II   SKIN CANCER EXCISION  ~ 2007   TONSILLECTOMY AND ADENOIDECTOMY  ~ [place] Bilateral 2001; 2012   right; left   VAGINAL HYSTERECTOMY N/A 01/29/2018   Procedure: HYSTERECTOMY VAGINAL;  Surgeon: Paula Compton, MD;  Location: [facility];  Service: Gynecology;  Laterality: N/A;     Current Outpatient Medications  Medication Sig Dispense Refill   acetaminophen (TYLENOL) 325 MG tablet Take 1-2 tablets (325-650 mg total) by mouth every 6 (six) hours as needed for mild pain or moderate pain.     alendronate (FOSAMAX) 70  MG tablet Take 70 mg by mouth every Tuesday.     atorvastatin (LIPITOR) 10 MG tablet Take 10 mg by mouth daily with supper.      Biotin 5000 MCG TABS Take 5,000 mcg by mouth daily with lunch.     Calcium-Phosphorus-Vitamin D (CITRACAL +D3 PO) Take 1 tablet by mouth in the morning and at bedtime.     CINNAMON PO Take 1 capsule by mouth in the morning and at bedtime. CinSulin 1.25     Glucosamine-Chondroitin (MOVE FREE PO) Take 1 tablet by mouth in the morning and at bedtime.     lisinopril (ZESTRIL) 2.5 MG tablet Take 1.25 mg by mouth 2 (two) times daily.      loratadine (CLARITIN) 10 MG tablet Take 10 mg by mouth at bedtime.     metFORMIN (GLUCOPHAGE) 500 MG tablet Take 500-1,000 mg by mouth See admin instructions. Take 500mg  by mouth in the morning, 500mg  at lunch and 1000mg  at supper time     metoprolol succinate (TOPROL-XL) 25 MG 24 hr tablet TAKE 1 TABLET BY MOUTH DAILY 90 tablet 3   Multiple Vitamin (MULTIVITAMIN WITH MINERALS) TABS tablet Take 1 tablet by mouth in the morning.     Omega-3 Fatty Acids ([place] WILD FISH OIL PO) Take 1 capsule by mouth 3 (three) times a week.     polyethylene glycol powder (GLYCOLAX/MIRALAX) powder Take 17 g by mouth daily as needed for moderate constipation.     Pumpkin Seed-Soy Germ (AZO BLADDER CONTROL/GO-LESS) CAPS Take 1 tablet by mouth in the morning and at bedtime.     sodium chloride (MURO 128) 2 % ophthalmic solution Place 1 drop into both eyes daily.     Specialty Vitamins Products (COLLAGEN ULTRA PO) Take 10 mLs by mouth daily. Zena Liquid Collagen + Biotin     tretinoin (RETIN-A) 0.05 % cream Apply 1 Application topically every other day. At night     No current facility-administered medications for this visit.    Allergies:   Sulfa antibiotics   Social History:  The patient  reports that she has never smoked. She has never used smokeless tobacco. She reports current alcohol use of about 6.0 standard drinks of alcohol per week. She reports that she does not use drugs.   Family History:  The patient's family history includes Alzheimer's disease in her mother; CAD in her father; Diabetes in her father; Heart attack in her father; Heart disease in her maternal grandmother; Hypertension in her father; Prostate cancer in her father.   ROS:  Please see the history of present illness.   Otherwise, review of systems is positive for none.   All other systems are reviewed and negative.   PHYSICAL EXAM: VS:  BP 114/70   Pulse 73   Ht 5\' 6"  (1.676 m)   Wt 146 lb (66.2 kg)   SpO2 97%   BMI 23.57 kg/m  ,  BMI Body mass index is 23.57 kg/m. GEN: Well nourished, well developed, in no acute distress  HEENT: normal  Neck: no JVD, carotid bruits, or masses Cardiac: RRR; no murmurs, rubs, or gallops,no edema  Respiratory:  clear to auscultation bilaterally, normal work of breathing GI: soft, nontender, nondistended, + BS MS: no deformity or atrophy  Skin: warm and dry, device site well healed Neuro:  Strength and sensation are intact Psych: euthymic mood, full affect  EKG:  EKG is ordered today. Personal review of the ekg ordered shows sinus rhythm, V paced  Personal review of  the device interrogation today. Results in [place]: 05/02/2022: BUN 19; Creatinine, Ser 0.86; Hemoglobin 13.2; Platelets 270; Potassium 4.8; Sodium 140    Lipid Panel  No results found for: "CHOL", "TRIG", "HDL", "CHOLHDL", "VLDL", "LDLCALC", "LDLDIRECT"   Wt Readings from Last 3 Encounters:  08/25/22 146 lb (66.2 kg)  06/27/22 146 lb 8 oz (66.5 kg)  05/12/22 146 lb (66.2 kg)      Other studies Reviewed: Additional studies/ records that were reviewed today include: TTE 03/14/22  Review of the above records today demonstrates:   1. Left ventricular ejection fraction, by estimation, is 45 to 50%. The  left ventricle has mildly decreased function. The left ventricle  demonstrates regional wall motion abnormalities. Left ventricular  diastolic parameters are indeterminate. Abnormal  (paradoxical) septal motion, consistent with RV pacemaker   2. Right ventricular systolic function is normal. The right ventricular  size is normal. Tricuspid regurgitation signal is inadequate for assessing  PA pressure.   3. The mitral valve is normal in structure. Trivial mitral valve  regurgitation. No evidence of mitral stenosis.   4. The aortic valve is tricuspid. Aortic valve regurgitation is not  visualized. No aortic stenosis is present.   5. The inferior vena cava is normal in size with greater than 50%   respiratory variability, suggesting right atrial pressure of 3 mmHg.    ASSESSMENT AND PLAN:  1.  Complete heart block: Status post Abbott CRT-P with device upgrade 04/04/2022.  Coronary sinus lead dislodged and she is now status post lead revision with implantation of a left bundle pacing lead.  Device functioning appropriately.  Lead impedance, threshold, sensing within normal limits.  No changes.  2.  Hypertension: Currently well-controlled  3.  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  Status post left bundle area pacing lead implant.  Feeling improved left bundle branch pacing.  No changes.   Current medicines are reviewed at length with the patient today.   The patient does not have concerns regarding her medicines.  The following changes were made today: None  Labs/ tests ordered today include:  Orders Placed This Encounter  Procedures   CUP [place]   EKG 12-Lead     Disposition:   FU 6 months  Signed, Jonae Renshaw Meredith Leeds, MD  08/25/2022 12:25 PM     [place] [address] [place] [id number] [phone] (office) [phone] (fax)

## 2022-09-20 NOTE — Progress Notes (Signed)
Remote pacemaker transmission.   

## 2022-11-15 ENCOUNTER — Ambulatory Visit (INDEPENDENT_AMBULATORY_CARE_PROVIDER_SITE_OTHER): Payer: Medicare Other

## 2022-11-15 DIAGNOSIS — I442 Atrioventricular block, complete: Secondary | ICD-10-CM | POA: Diagnosis not present

## 2022-11-16 LAB — CUP PACEART REMOTE DEVICE CHECK
Battery Remaining Longevity: 89 mo
Battery Remaining Percentage: 91 %
Battery Voltage: 2.99 V
Brady Statistic AP VP Percent: 21 %
Brady Statistic AP VS Percent: 1 %
Brady Statistic AS VP Percent: 79 %
Brady Statistic AS VS Percent: 1 %
Brady Statistic RA Percent Paced: 21 %
Date Time Interrogation Session: 20240606020013
Implantable Lead Connection Status: 753985
Implantable Lead Connection Status: 753985
Implantable Lead Connection Status: 753985
Implantable Lead Implant Date: 20130917
Implantable Lead Implant Date: 20130917
Implantable Lead Implant Date: 20231208
Implantable Lead Location: 753858
Implantable Lead Location: 753859
Implantable Lead Location: 753860
Implantable Lead Model: 1944
Implantable Lead Model: 1948
Implantable Pulse Generator Implant Date: 20231208
Lead Channel Impedance Value: 530 Ohm
Lead Channel Impedance Value: 550 Ohm
Lead Channel Impedance Value: 640 Ohm
Lead Channel Pacing Threshold Amplitude: 0.5 V
Lead Channel Pacing Threshold Amplitude: 0.75 V
Lead Channel Pacing Threshold Amplitude: 0.875 V
Lead Channel Pacing Threshold Pulse Width: 0.5 ms
Lead Channel Pacing Threshold Pulse Width: 0.5 ms
Lead Channel Pacing Threshold Pulse Width: 0.5 ms
Lead Channel Sensing Intrinsic Amplitude: 1.2 mV
Lead Channel Sensing Intrinsic Amplitude: 5.8 mV
Lead Channel Setting Pacing Amplitude: 2 V
Lead Channel Setting Pacing Amplitude: 2 V
Lead Channel Setting Pacing Amplitude: 2 V
Lead Channel Setting Pacing Pulse Width: 0.5 ms
Lead Channel Setting Pacing Pulse Width: 0.5 ms
Lead Channel Setting Sensing Sensitivity: 4 mV
Pulse Gen Model: 3222
Pulse Gen Serial Number: 3997337

## 2022-12-12 NOTE — Progress Notes (Signed)
Remote pacemaker transmission.   

## 2023-02-14 ENCOUNTER — Ambulatory Visit: Payer: Medicare Other

## 2023-02-19 ENCOUNTER — Ambulatory Visit: Payer: Medicare Other

## 2023-02-19 DIAGNOSIS — I442 Atrioventricular block, complete: Secondary | ICD-10-CM

## 2023-02-19 LAB — CUP PACEART REMOTE DEVICE CHECK
Battery Remaining Longevity: 88 mo
Battery Remaining Percentage: 88 %
Battery Voltage: 2.99 V
Brady Statistic AP VP Percent: 19 %
Brady Statistic AP VS Percent: 1 %
Brady Statistic AS VP Percent: 81 %
Brady Statistic AS VS Percent: 1 %
Brady Statistic RA Percent Paced: 19 %
Date Time Interrogation Session: 20240909013029
Implantable Lead Connection Status: 753985
Implantable Lead Connection Status: 753985
Implantable Lead Connection Status: 753985
Implantable Lead Implant Date: 20130917
Implantable Lead Implant Date: 20130917
Implantable Lead Implant Date: 20231208
Implantable Lead Location: 753858
Implantable Lead Location: 753859
Implantable Lead Location: 753860
Implantable Lead Model: 1944
Implantable Lead Model: 1948
Implantable Pulse Generator Implant Date: 20231208
Lead Channel Impedance Value: 560 Ohm
Lead Channel Impedance Value: 590 Ohm
Lead Channel Impedance Value: 660 Ohm
Lead Channel Pacing Threshold Amplitude: 0.5 V
Lead Channel Pacing Threshold Amplitude: 0.625 V
Lead Channel Pacing Threshold Amplitude: 1 V
Lead Channel Pacing Threshold Pulse Width: 0.5 ms
Lead Channel Pacing Threshold Pulse Width: 0.5 ms
Lead Channel Pacing Threshold Pulse Width: 0.5 ms
Lead Channel Sensing Intrinsic Amplitude: 2.2 mV
Lead Channel Sensing Intrinsic Amplitude: 5.8 mV
Lead Channel Setting Pacing Amplitude: 2 V
Lead Channel Setting Pacing Amplitude: 2 V
Lead Channel Setting Pacing Amplitude: 2 V
Lead Channel Setting Pacing Pulse Width: 0.5 ms
Lead Channel Setting Pacing Pulse Width: 0.5 ms
Lead Channel Setting Sensing Sensitivity: 4 mV
Pulse Gen Model: 3222
Pulse Gen Serial Number: 3997337

## 2023-02-20 NOTE — Progress Notes (Unsigned)
Cardiology Office Note Date:  02/20/2023  Patient ID:  MOSELLE Norton, DOB Aug 16, 1945, MRN [id number] PCP:  Chilton Greathouse, MD  Cardiologist/Electrophysiologist: Dr. Johney Frame    Chief Complaint:  *** 6 mo  History of Present Illness: Lauren Norton is a 77 y.o. female with history of HTN, HLD, DM, advanced heart block w/PPM  She saw Dr. Elberta Fortis 08/25/22, feeling well   Device information SJM dual chamber PPM implanted 02/27/2012 >>> upgrade to CRT _ P 04/04/22,  LV lead with high thresholds at her wound check>> lead revision 05/19/22   Past Medical History:  Diagnosis Date   Anxiety    Bronchitis 2018   Cataract 2017   both eyes   Diabetes (HCC)    HLD (hyperlipidemia)    HTN (hypertension)    Osteoarthritis of both hips    both hips replaced   Pneumonia    many years ago   Presence of permanent cardiac pacemaker    2013   Second degree AV block    a.s/p MDT dual chamber PPM    Shingles    Skin cancer ~ 2007   "back"    Past Surgical History:  Procedure Laterality Date   ANTERIOR AND POSTERIOR REPAIR N/A 01/29/2018   Procedure: ANTERIOR (CYSTOCELE) AND POSTERIOR REPAIR (RECTOCELE);  Surgeon: Huel Cote, MD;  Location: [facility];  Service: Gynecology;  Laterality: N/A;   BIV UPGRADE N/A 04/04/2022   Procedure: BIV UPGRADE;  Surgeon: Regan Lemming, MD;  Location: [facility];  Service: Cardiovascular;  Laterality: N/A;   CATARACT EXTRACTION, BILATERAL  2017   CYST REMOVAL NECK  2016   CYSTOSCOPY N/A 01/29/2018   Procedure: CYSTOSCOPY;  Surgeon: Huel Cote, MD;  Location: [facility];  Service: Gynecology;  Laterality: N/A;   defribrillator     JOINT REPLACEMENT Bilateral    bilateral hip replacements   LEAD REVISION/REPAIR N/A 05/19/2022   Procedure: LEAD REVISION/REPAIR - PACEMAKER;  Surgeon: Regan Lemming, MD;  [facility]: [facility];  Service: Cardiovascular;  Laterality: N/A;   left elbow fracture     age 77    PERMANENT PACEMAKER INSERTION N/A 02/27/2012   a. MDT dual chamber PPM implanted by Dr Johney Frame for Mobitz II   SKIN CANCER EXCISION  ~ 2007   TONSILLECTOMY AND ADENOIDECTOMY  ~ 1960   TOTAL HIP ARTHROPLASTY Bilateral 2001; 2012   right; left   VAGINAL HYSTERECTOMY N/A 01/29/2018   Procedure: HYSTERECTOMY VAGINAL;  Surgeon: Huel Cote, MD;  Location: [facility];  Service: Gynecology;  Laterality: N/A;    Current Outpatient Medications  Medication Sig Dispense Refill   acetaminophen (TYLENOL) 325 MG tablet Take 1-2 tablets (325-650 mg total) by mouth every 6 (six) hours as needed for mild pain or moderate pain.     alendronate (FOSAMAX) 70 MG tablet Take 70 mg by mouth every Tuesday.     atorvastatin (LIPITOR) 10 MG tablet Take 10 mg by mouth daily with supper.      Biotin 5000 MCG TABS Take 5,000 mcg by mouth daily with lunch.     Calcium-Phosphorus-Vitamin D (CITRACAL +D3 PO) Take 1 tablet by mouth in the morning and at bedtime.     CINNAMON PO Take 1 capsule by mouth in the morning and at bedtime. CinSulin 1.25     Glucosamine-Chondroitin (MOVE FREE PO) Take 1 tablet by mouth in the morning and at bedtime.     lisinopril (ZESTRIL) 2.5 MG tablet Take 1.25 mg by mouth 2 (  two) times daily.     loratadine (CLARITIN) 10 MG tablet Take 10 mg by mouth at bedtime.     metFORMIN (GLUCOPHAGE) 500 MG tablet Take 500-1,000 mg by mouth See admin instructions. Take 500mg  by mouth in the morning, 500mg  at lunch and 1000mg  at supper time     metoprolol succinate (TOPROL-XL) 25 MG 24 hr tablet TAKE 1 TABLET BY MOUTH DAILY 90 tablet 3   Multiple Vitamin (MULTIVITAMIN WITH MINERALS) TABS tablet Take 1 tablet by mouth in the morning.     Omega-3 Fatty Acids ([place] WILD FISH OIL PO) Take 1 capsule by mouth 3 (three) times a week.     polyethylene glycol powder (GLYCOLAX/MIRALAX) powder Take 17 g by mouth daily as needed for moderate constipation.     Pumpkin Seed-Soy Germ (AZO BLADDER CONTROL/GO-LESS) CAPS  Take 1 tablet by mouth in the morning and at bedtime.     sodium chloride (MURO 128) 2 % ophthalmic solution Place 1 drop into both eyes daily.     Specialty Vitamins Products (COLLAGEN ULTRA PO) Take 10 mLs by mouth daily. Zena Liquid Collagen + Biotin     tretinoin (RETIN-A) 0.05 % cream Apply 1 Application topically every other day. At night     No current facility-administered medications for this visit.    Allergies:   Sulfa antibiotics   Social History:  The patient  reports that she has never smoked. She has never used smokeless tobacco. She reports current alcohol use of about 6.0 standard drinks of alcohol per week. She reports that she does not use drugs.   Family History:  The patient's family history includes Alzheimer's disease in her mother; CAD in her father; Diabetes in her father; Heart attack in her father; Heart disease in her maternal grandmother; Hypertension in her father; Prostate cancer in her father.  ROS:  Please see the history of present illness. All other systems are reviewed and otherwise negative.   PHYSICAL EXAM:  VS:  There were no vitals taken for this visit. BMI: There is no height or weight on file to calculate BMI. Well nourished, well developed, in no acute distress  HEENT: normocephalic, atraumatic  Neck: no JVD, carotid bruits or masses Cardiac: *** RRR; no significant murmurs, no rubs, or gallops Lungs:  *** CTA b/l, no wheezing, rhonchi or rales  Abd: soft, nontender MS: no deformity or atrophy Ext: ***  no edema  Skin: warm and dry, no rash Neuro:  No gross deficits appreciated Psych: euthymic mood, full affect  *** PPM site is stable, no tethering or discomfort   EKG:  not done today  PPM interrogation done today and reviewed by myself: ***  TTE 03/14/22   1. Left ventricular ejection fraction, by estimation, is 45 to 50%. The  left ventricle has mildly decreased function. The left ventricle  demonstrates regional wall motion  abnormalities. Left ventricular  diastolic parameters are indeterminate. Abnormal  (paradoxical) septal motion, consistent with RV pacemaker   2. Right ventricular systolic function is normal. The right ventricular  size is normal. Tricuspid regurgitation signal is inadequate for assessing  PA pressure.   3. The mitral valve is normal in structure. Trivial mitral valve  regurgitation. No evidence of mitral stenosis.   4. The aortic valve is tricuspid. Aortic valve regurgitation is not  visualized. No aortic stenosis is present.   5. The inferior vena cava is normal in size with greater than 50%  respiratory variability, suggesting right atrial pressure of 3  mmHg.    Recent Labs: 05/02/2022: BUN 19; Creatinine, Ser 0.86; Hemoglobin 13.2; Platelets 270; Potassium 4.8; Sodium 140  No results found for requested labs within last 365 days.   CrCl cannot be calculated (Patient's most recent lab result is older than the maximum 21 days allowed.).   Wt Readings from Last 3 Encounters:  08/25/22 146 lb (66.2 kg)  06/27/22 146 lb 8 oz (66.5 kg)  05/12/22 146 lb (66.2 kg)     Other studies reviewed: Additional studies/records reviewed today include: summarized above  ASSESSMENT AND PLAN:  1. PPM ***   2. HTN     *** Looks good   3. 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  The patient did not have any concerns regarding medicines.  Norma Fredrickson, PA-C 02/20/2023 7:22 AM     [facility] [address] [place] [address] [phone] (office)  [phone] (fax)

## 2023-02-21 ENCOUNTER — Ambulatory Visit: Payer: Medicare Other | Attending: Physician Assistant | Admitting: Physician Assistant

## 2023-02-21 ENCOUNTER — Encounter: Payer: Self-pay | Admitting: Physician Assistant

## 2023-02-21 VITALS — BP 148/76 | HR 78 | Ht 66.5 in | Wt 147.0 lb

## 2023-02-21 DIAGNOSIS — I428 Other cardiomyopathies: Secondary | ICD-10-CM

## 2023-02-21 DIAGNOSIS — I10 Essential (primary) hypertension: Secondary | ICD-10-CM

## 2023-02-21 DIAGNOSIS — Z950 Presence of cardiac pacemaker: Secondary | ICD-10-CM

## 2023-02-21 LAB — CUP PACEART INCLINIC DEVICE CHECK
Battery Remaining Longevity: 87 mo
Battery Voltage: 2.99 V
Brady Statistic RA Percent Paced: 19 %
Brady Statistic RV Percent Paced: 99.97 %
Date Time Interrogation Session: 20240911161540
Implantable Lead Connection Status: 753985
Implantable Lead Connection Status: 753985
Implantable Lead Connection Status: 753985
Implantable Lead Implant Date: 20130917
Implantable Lead Implant Date: 20130917
Implantable Lead Implant Date: 20231208
Implantable Lead Location: 753858
Implantable Lead Location: 753859
Implantable Lead Location: 753860
Implantable Lead Model: 1944
Implantable Lead Model: 1948
Implantable Pulse Generator Implant Date: 20231208
Lead Channel Impedance Value: 575 Ohm
Lead Channel Impedance Value: 625 Ohm
Lead Channel Impedance Value: 687.5 Ohm
Lead Channel Pacing Threshold Amplitude: 0.5 V
Lead Channel Pacing Threshold Amplitude: 0.5 V
Lead Channel Pacing Threshold Amplitude: 0.75 V
Lead Channel Pacing Threshold Amplitude: 1 V
Lead Channel Pacing Threshold Pulse Width: 0.3 ms
Lead Channel Pacing Threshold Pulse Width: 0.3 ms
Lead Channel Pacing Threshold Pulse Width: 0.5 ms
Lead Channel Pacing Threshold Pulse Width: 0.5 ms
Lead Channel Sensing Intrinsic Amplitude: 2.2 mV
Lead Channel Sensing Intrinsic Amplitude: 5.8 mV
Lead Channel Setting Pacing Amplitude: 2 V
Lead Channel Setting Pacing Amplitude: 2 V
Lead Channel Setting Pacing Amplitude: 2 V
Lead Channel Setting Pacing Pulse Width: 0.5 ms
Lead Channel Setting Pacing Pulse Width: 0.5 ms
Lead Channel Setting Sensing Sensitivity: 4 mV
Pulse Gen Model: 3222
Pulse Gen Serial Number: 3997337

## 2023-02-21 NOTE — Patient Instructions (Addendum)
Medication Instructions:    START TAKING 12.5 MG 1/2 TABLET OF METOPROLOL 25 MG   *If you need a refill on your cardiac medications before your next appointment, please call your pharmacy*   Lab Work: NONE ORDERED  TODAY   If you have labs (blood work) drawn today and your tests are completely normal, you will receive your results only by: MyChart Message (if you have MyChart) OR A paper copy in the mail If you have any lab test that is abnormal or we need to change your treatment, we will call you to review the results.   Testing/Procedures:  NONE ORDERED  TODAY    Follow-Up: At [facility], you and your health needs are our priority.  As part of our continuing mission to provide you with exceptional heart care, we have created designated Provider Care Teams.  These Care Teams include your primary Cardiologist (physician) and Advanced Practice Providers (APPs -  Physician Assistants and Nurse Practitioners) who all work together to provide you with the care you need, when you need it.  We recommend signing up for the patient portal called "MyChart".  Sign up information is provided on this After Visit Summary.  MyChart is used to connect with patients for Virtual Visits (Telemedicine).  Patients are able to view lab/test results, encounter notes, upcoming appointments, etc.  Non-urgent messages can be sent to your provider as well.   To learn more about what you can do with MyChart, go to [email or web address].    Your next appointment:    6 MONTH S  Provider:   You may see Will Jorja Loa, MD or one of the following Advanced Practice Providers on your designated Care Team:   Francis Dowse, [place]  Other Instructions

## 2023-03-08 NOTE — Progress Notes (Signed)
Remote pacemaker transmission.   

## 2023-05-08 ENCOUNTER — Encounter (HOSPITAL_COMMUNITY): Payer: Self-pay | Admitting: Cardiology

## 2023-05-16 ENCOUNTER — Ambulatory Visit: Payer: Medicare Other

## 2023-05-21 ENCOUNTER — Ambulatory Visit (INDEPENDENT_AMBULATORY_CARE_PROVIDER_SITE_OTHER): Payer: Medicare Other

## 2023-05-21 DIAGNOSIS — I442 Atrioventricular block, complete: Secondary | ICD-10-CM | POA: Diagnosis not present

## 2023-05-24 LAB — CUP PACEART REMOTE DEVICE CHECK
Battery Remaining Longevity: 84 mo
Battery Remaining Percentage: 85 %
Battery Voltage: 2.99 V
Brady Statistic AP VP Percent: 15 %
Brady Statistic AP VS Percent: 1 %
Brady Statistic AS VP Percent: 85 %
Brady Statistic AS VS Percent: 1 %
Brady Statistic RA Percent Paced: 15 %
Date Time Interrogation Session: 20241205020012
Implantable Lead Connection Status: 753985
Implantable Lead Connection Status: 753985
Implantable Lead Connection Status: 753985
Implantable Lead Implant Date: 20130917
Implantable Lead Implant Date: 20130917
Implantable Lead Implant Date: 20231208
Implantable Lead Location: 753858
Implantable Lead Location: 753859
Implantable Lead Location: 753860
Implantable Lead Model: 1944
Implantable Lead Model: 1948
Implantable Pulse Generator Implant Date: 20231208
Lead Channel Impedance Value: 530 Ohm
Lead Channel Impedance Value: 580 Ohm
Lead Channel Impedance Value: 650 Ohm
Lead Channel Pacing Threshold Amplitude: 0.5 V
Lead Channel Pacing Threshold Amplitude: 0.75 V
Lead Channel Pacing Threshold Amplitude: 1 V
Lead Channel Pacing Threshold Pulse Width: 0.3 ms
Lead Channel Pacing Threshold Pulse Width: 0.5 ms
Lead Channel Pacing Threshold Pulse Width: 0.5 ms
Lead Channel Sensing Intrinsic Amplitude: 2.1 mV
Lead Channel Sensing Intrinsic Amplitude: 5.8 mV
Lead Channel Setting Pacing Amplitude: 2 V
Lead Channel Setting Pacing Amplitude: 2 V
Lead Channel Setting Pacing Amplitude: 2 V
Lead Channel Setting Pacing Pulse Width: 0.5 ms
Lead Channel Setting Pacing Pulse Width: 0.5 ms
Lead Channel Setting Sensing Sensitivity: 4 mV
Pulse Gen Model: 3222
Pulse Gen Serial Number: 3997337

## 2023-06-29 NOTE — Addendum Note (Signed)
Addended by: Geralyn Flash D on: 06/29/2023 10:06 AM   Modules accepted: Orders

## 2023-06-29 NOTE — Progress Notes (Signed)
Remote pacemaker transmission.   

## 2023-08-15 ENCOUNTER — Ambulatory Visit: Payer: Medicare Other

## 2023-08-20 ENCOUNTER — Ambulatory Visit (INDEPENDENT_AMBULATORY_CARE_PROVIDER_SITE_OTHER): Payer: Medicare Other

## 2023-08-20 DIAGNOSIS — I442 Atrioventricular block, complete: Secondary | ICD-10-CM | POA: Diagnosis not present

## 2023-08-21 LAB — CUP PACEART REMOTE DEVICE CHECK
Battery Remaining Longevity: 82 mo
Battery Remaining Percentage: 82 %
Battery Voltage: 2.99 V
Brady Statistic AP VP Percent: 14 %
Brady Statistic AP VS Percent: 1 %
Brady Statistic AS VP Percent: 86 %
Brady Statistic AS VS Percent: 1 %
Brady Statistic RA Percent Paced: 13 %
Date Time Interrogation Session: 20250310222603
Implantable Lead Connection Status: 753985
Implantable Lead Connection Status: 753985
Implantable Lead Connection Status: 753985
Implantable Lead Implant Date: 20130917
Implantable Lead Implant Date: 20130917
Implantable Lead Implant Date: 20231208
Implantable Lead Location: 753858
Implantable Lead Location: 753859
Implantable Lead Location: 753860
Implantable Lead Model: 1944
Implantable Lead Model: 1948
Implantable Pulse Generator Implant Date: 20231208
Lead Channel Impedance Value: 550 Ohm
Lead Channel Impedance Value: 650 Ohm
Lead Channel Impedance Value: 660 Ohm
Lead Channel Pacing Threshold Amplitude: 0.5 V
Lead Channel Pacing Threshold Amplitude: 0.875 V
Lead Channel Pacing Threshold Amplitude: 1 V
Lead Channel Pacing Threshold Pulse Width: 0.3 ms
Lead Channel Pacing Threshold Pulse Width: 0.5 ms
Lead Channel Pacing Threshold Pulse Width: 0.5 ms
Lead Channel Sensing Intrinsic Amplitude: 2.1 mV
Lead Channel Sensing Intrinsic Amplitude: 5.8 mV
Lead Channel Setting Pacing Amplitude: 2 V
Lead Channel Setting Pacing Amplitude: 2 V
Lead Channel Setting Pacing Amplitude: 2 V
Lead Channel Setting Pacing Pulse Width: 0.5 ms
Lead Channel Setting Pacing Pulse Width: 0.5 ms
Lead Channel Setting Sensing Sensitivity: 4 mV
Pulse Gen Model: 3222
Pulse Gen Serial Number: 3997337

## 2023-08-29 ENCOUNTER — Other Ambulatory Visit: Payer: Self-pay | Admitting: Cardiology

## 2023-10-08 NOTE — Progress Notes (Signed)
 Remote pacemaker transmission.

## 2023-10-08 NOTE — Addendum Note (Signed)
 Addended by: Edra Govern D on: 10/08/2023 02:57 PM   Modules accepted: Orders

## 2023-11-14 ENCOUNTER — Ambulatory Visit: Payer: Medicare Other

## 2023-11-19 ENCOUNTER — Ambulatory Visit (INDEPENDENT_AMBULATORY_CARE_PROVIDER_SITE_OTHER): Payer: Medicare Other

## 2023-11-19 DIAGNOSIS — I442 Atrioventricular block, complete: Secondary | ICD-10-CM

## 2023-11-20 ENCOUNTER — Ambulatory Visit: Payer: Self-pay | Admitting: Cardiology

## 2023-11-20 LAB — CUP PACEART REMOTE DEVICE CHECK
Battery Remaining Longevity: 78 mo
Battery Remaining Percentage: 79 %
Battery Voltage: 2.99 V
Brady Statistic AP VP Percent: 12 %
Brady Statistic AP VS Percent: 1 %
Brady Statistic AS VP Percent: 88 %
Brady Statistic AS VS Percent: 1 %
Brady Statistic RA Percent Paced: 12 %
Date Time Interrogation Session: 20250609040015
Implantable Lead Connection Status: 753985
Implantable Lead Connection Status: 753985
Implantable Lead Connection Status: 753985
Implantable Lead Implant Date: 20130917
Implantable Lead Implant Date: 20130917
Implantable Lead Implant Date: 20231208
Implantable Lead Location: 753858
Implantable Lead Location: 753859
Implantable Lead Location: 753860
Implantable Lead Model: 1944
Implantable Lead Model: 1948
Implantable Pulse Generator Implant Date: 20231208
Lead Channel Impedance Value: 540 Ohm
Lead Channel Impedance Value: 540 Ohm
Lead Channel Impedance Value: 650 Ohm
Lead Channel Pacing Threshold Amplitude: 0.5 V
Lead Channel Pacing Threshold Amplitude: 0.75 V
Lead Channel Pacing Threshold Amplitude: 1 V
Lead Channel Pacing Threshold Pulse Width: 0.3 ms
Lead Channel Pacing Threshold Pulse Width: 0.5 ms
Lead Channel Pacing Threshold Pulse Width: 0.5 ms
Lead Channel Sensing Intrinsic Amplitude: 1.6 mV
Lead Channel Sensing Intrinsic Amplitude: 5.8 mV
Lead Channel Setting Pacing Amplitude: 2 V
Lead Channel Setting Pacing Amplitude: 2 V
Lead Channel Setting Pacing Amplitude: 2 V
Lead Channel Setting Pacing Pulse Width: 0.5 ms
Lead Channel Setting Pacing Pulse Width: 0.5 ms
Lead Channel Setting Sensing Sensitivity: 4 mV
Pulse Gen Model: 3222
Pulse Gen Serial Number: 3997337

## 2024-01-03 NOTE — Progress Notes (Signed)
 Remote pacemaker transmission.

## 2024-01-03 NOTE — Addendum Note (Signed)
 Addended by: TAWNI DRILLING D on: 01/03/2024 10:19 AM   Modules accepted: Orders

## 2024-02-13 ENCOUNTER — Ambulatory Visit: Payer: Medicare Other

## 2024-02-18 ENCOUNTER — Ambulatory Visit: Payer: Medicare Other

## 2024-02-18 DIAGNOSIS — I442 Atrioventricular block, complete: Secondary | ICD-10-CM

## 2024-02-19 LAB — CUP PACEART REMOTE DEVICE CHECK
Battery Remaining Longevity: 76 mo
Battery Remaining Percentage: 76 %
Battery Voltage: 2.99 V
Brady Statistic AP VP Percent: 13 %
Brady Statistic AP VS Percent: 1 %
Brady Statistic AS VP Percent: 87 %
Brady Statistic AS VS Percent: 1 %
Brady Statistic RA Percent Paced: 12 %
Date Time Interrogation Session: 20250908054623
Implantable Lead Connection Status: 753985
Implantable Lead Connection Status: 753985
Implantable Lead Connection Status: 753985
Implantable Lead Implant Date: 20130917
Implantable Lead Implant Date: 20130917
Implantable Lead Implant Date: 20231208
Implantable Lead Location: 753858
Implantable Lead Location: 753859
Implantable Lead Location: 753860
Implantable Lead Model: 1944
Implantable Lead Model: 1948
Implantable Pulse Generator Implant Date: 20231208
Lead Channel Impedance Value: 540 Ohm
Lead Channel Impedance Value: 650 Ohm
Lead Channel Impedance Value: 710 Ohm
Lead Channel Pacing Threshold Amplitude: 0.5 V
Lead Channel Pacing Threshold Amplitude: 0.75 V
Lead Channel Pacing Threshold Amplitude: 1 V
Lead Channel Pacing Threshold Pulse Width: 0.3 ms
Lead Channel Pacing Threshold Pulse Width: 0.5 ms
Lead Channel Pacing Threshold Pulse Width: 0.5 ms
Lead Channel Sensing Intrinsic Amplitude: 2.1 mV
Lead Channel Sensing Intrinsic Amplitude: 8.5 mV
Lead Channel Setting Pacing Amplitude: 2 V
Lead Channel Setting Pacing Amplitude: 2 V
Lead Channel Setting Pacing Amplitude: 2 V
Lead Channel Setting Pacing Pulse Width: 0.5 ms
Lead Channel Setting Pacing Pulse Width: 0.5 ms
Lead Channel Setting Sensing Sensitivity: 4 mV
Pulse Gen Model: 3222
Pulse Gen Serial Number: 3997337

## 2024-02-20 ENCOUNTER — Ambulatory Visit: Payer: Self-pay | Admitting: Cardiology

## 2024-02-28 NOTE — Progress Notes (Signed)
 Remote PPM Transmission

## 2024-03-06 ENCOUNTER — Ambulatory Visit: Admitting: Physician Assistant

## 2024-03-18 NOTE — Progress Notes (Unsigned)
 Cardiology Office Note Date:  03/18/2024  Patient ID:  Lauren Norton May 01, 1946, MRN [id number] PCP:  Janey Santos, MD  Cardiologist/Electrophysiologist: Dr. Kelsie >> Dr. Inocencio    Chief Complaint:  6 mo (overdue) >> annual visit  History of Present Illness: Lauren Norton is a 78 y.o. female with history of  HTN, HLD, DM,  advanced heart block w/PPM  She saw Dr. Inocencio 08/25/22, feeling well.  I saw her 02/21/23 She is accompanied by her husband today She is doing well, though thinks the metoprolol  still makes her feel sluggish, less since she moved it to HS dosing. No CP,palpitations No SOB She keeps strict watch on her BP Generally tends to be low (her cuff has been checked vis her PMD office and found accurate) SBP tend to be 90's-100's though with stress/anxiety rises to 130's  She tales  1/4tab of her lisinopril  (0.625mg ) daily Whole tabe of metoprolol  25mg  in the evening She will take extra lisinopril  when her BP is high (130's or so) and hold it when it gets low She inquires about taking less metoprolol  to see if it will help with her fatigue PMD does labs  Discussed difficulty in micromanaging her BP, taking BP meds some days, others none, other days some meds, not all  She has been doing 12.5mg  daily of the metoprolol  with stable/good BPs Infrequently uses PRN propranolol   TODAY  She is accompanied by her husband She has had a good year! BPs have been great She is regularly doing water  aerobics and feels quite well.  Device information SJM dual chamber PPM implanted 02/27/2012 >>> upgrade to CRT _ P 04/04/22,  LV lead with high thresholds at her wound check>> lead revision 05/19/22   Past Medical History:  Diagnosis Date   Anxiety    Bronchitis 2018   Cataract 2017   both eyes   Diabetes (HCC)    HLD (hyperlipidemia)    HTN (hypertension)    Osteoarthritis of both hips    both hips replaced   Pneumonia    many years ago    Presence of permanent cardiac pacemaker    2013   Second degree AV block    a.s/p MDT dual chamber PPM    Shingles    Skin cancer ~ 2007   back    Past Surgical History:  Procedure Laterality Date   ANTERIOR AND POSTERIOR REPAIR N/A 01/29/2018   Procedure: ANTERIOR (CYSTOCELE) AND POSTERIOR REPAIR (RECTOCELE);  Surgeon: Estelle Service, MD;  Location: [facility];  Service: Gynecology;  Laterality: N/A;   BIV UPGRADE N/A 04/04/2022   Procedure: BIV UPGRADE;  Surgeon: Inocencio Soyla Lunger, MD;  [facility];  Service: Cardiovascular;  Laterality: N/A;   CATARACT EXTRACTION, BILATERAL  2017   CYST REMOVAL NECK  2016   CYSTOSCOPY N/A 01/29/2018   Procedure: CYSTOSCOPY;  Surgeon: Estelle Service, MD;  Location: [facility];  Service: Gynecology;  Laterality: N/A;   defribrillator     JOINT REPLACEMENT Bilateral    bilateral hip replacements   LEAD REVISION/REPAIR N/A 05/19/2022   Procedure: LEAD REVISION/REPAIR - PACEMAKER;  Surgeon: Inocencio Soyla Lunger, MD;  Location: [facility];  Service: Cardiovascular;  Laterality: N/A;   left elbow fracture     age 59   PERMANENT PACEMAKER INSERTION N/A 02/27/2012   a. MDT dual chamber PPM implanted by Dr Kelsie for Mobitz II   SKIN CANCER EXCISION  ~ 2007   TONSILLECTOMY AND ADENOIDECTOMY  ~  1960   TOTAL HIP ARTHROPLASTY Bilateral 2001; 2012   right; left   VAGINAL HYSTERECTOMY N/A 01/29/2018   Procedure: HYSTERECTOMY VAGINAL;  Surgeon: Estelle Service, MD;  Location: [facility];  Service: Gynecology;  Laterality: N/A;    Current Outpatient Medications  Medication Sig Dispense Refill   acetaminophen  (TYLENOL ) 325 MG tablet Take 1-2 tablets (325-650 mg total) by mouth every 6 (six) hours as needed for mild pain or moderate pain.     alendronate (FOSAMAX) 70 MG tablet Take 70 mg by mouth every Tuesday.     atorvastatin  (LIPITOR) 10 MG tablet Take 10 mg by mouth daily with supper.      Biotin 5000 MCG TABS Take 5,000 mcg by  mouth daily with lunch.     Calcium -Phosphorus-Vitamin D (CITRACAL +D3 PO) Take 1 tablet by mouth in the morning and at bedtime.     CINNAMON PO Take 1 capsule by mouth in the morning and at bedtime. CinSulin 1.25     Glucosamine-Chondroitin (MOVE FREE PO) Take 1 tablet by mouth in the morning and at bedtime.     lisinopril  (ZESTRIL ) 2.5 MG tablet Take 1.25 mg by mouth 2 (two) times daily.     loratadine  (CLARITIN ) 10 MG tablet Take 10 mg by mouth at bedtime.     metFORMIN  (GLUCOPHAGE ) 500 MG tablet Take 500-1,000 mg by mouth See admin instructions. Take 500mg  by mouth in the morning, 500mg  at lunch and 1000mg  at supper time     metoprolol  succinate (TOPROL -XL) 25 MG 24 hr tablet TAKE 1 TABLET BY MOUTH ONCE  DAILY 90 tablet 1   Multiple Vitamin (MULTIVITAMIN WITH MINERALS) TABS tablet Take 1 tablet by mouth in the morning.     Omega-3 Fatty Acids ([place]  WILD FISH OIL PO) Take 1 capsule by mouth 3 (three) times a week.     polyethylene glycol powder (GLYCOLAX /MIRALAX ) powder Take 17 g by mouth daily as needed for moderate constipation.     Pumpkin Seed-Soy Germ (AZO BLADDER CONTROL/GO-LESS) CAPS Take 1 tablet by mouth in the morning and at bedtime.     sodium chloride  (MURO 128) 2 % ophthalmic solution Place 1 drop into both eyes daily.     Specialty Vitamins Products (COLLAGEN ULTRA PO) Take 10 mLs by mouth daily. Zena Liquid Collagen + Biotin     tretinoin (RETIN-A) 0.05 % cream Apply 1 Application topically every other day. At night     No current facility-administered medications for this visit.    Allergies:   Sulfa antibiotics   Social History:  The patient  reports that she has never smoked. She has never used smokeless tobacco. She reports current alcohol use of about 6.0 standard drinks of alcohol per week. She reports that she does not use drugs.   Family History:  The patient's family history includes Alzheimer's disease in her mother; CAD in her father; Diabetes in her father;  Heart attack in her father; Heart disease in her maternal grandmother; Hypertension in her father; Prostate cancer in her father.  ROS:  Please see the history of present illness. All other systems are reviewed and otherwise negative.   PHYSICAL EXAM:  VS:  There were no vitals taken for this visit. BMI: There is no height or weight on file to calculate BMI. Well nourished, well developed, in no acute distress  HEENT: normocephalic, atraumatic  Neck: no JVD, carotid bruits or masses Cardiac: RRR; no significant murmurs, no rubs, or gallops Lungs:  CTA b/l, no wheezing, rhonchi  or rales  Abd: soft, nontender MS: no deformity or atrophy Ext: no edema  Skin: warm and dry, no rash Neuro:  No gross deficits appreciated Psych: euthymic mood, full affect  PPM site is stable, no tethering or discomfort   EKG:  done today and reviewed by myself SR 64bpm, V paced tiny + deflecion V1, + lead I.  Qrs  PPM interrogation done today and reviewed by myself: Battery and lead measurements are good Known to be dependent/symptomatic with RV sensing test > not pursued today >99 %BP 10 AMS episodes All available EGMs reviewed, thar e are brief ATach/AFlutter, longest 16 seconds One is a fleeting noise (appears on both leads, though tiny and not seen by the device on V lead) > likely external   TTE 03/14/22   1. Left ventricular ejection fraction, by estimation, is 45 to 50%. The  left ventricle has mildly decreased function. The left ventricle  demonstrates regional wall motion abnormalities. Left ventricular  diastolic parameters are indeterminate. Abnormal  (paradoxical) septal motion, consistent with RV pacemaker   2. Right ventricular systolic function is normal. The right ventricular  size is normal. Tricuspid regurgitation signal is inadequate for assessing  PA pressure.   3. The mitral valve is normal in structure. Trivial mitral valve  regurgitation. No evidence of mitral stenosis.    4. The aortic valve is tricuspid. Aortic valve regurgitation is not  visualized. No aortic stenosis is present.   5. The inferior vena cava is normal in size with greater than 50%  respiratory variability, suggesting right atrial pressure of 3 mmHg.    Recent Labs: No results found for requested labs within last 365 days.  No results found for requested labs within last 365 days.   CrCl cannot be calculated (Patient's most recent lab result is older than the maximum 21 days allowed.).   Wt Readings from Last 3 Encounters:  02/21/23 147 lb (66.7 kg)  08/25/22 146 lb (66.2 kg)  06/27/22 146 lb 8 oz (66.5 kg)     Other studies reviewed: Additional studies/records reviewed today include: summarized above  ASSESSMENT AND PLAN:  1. CRT-P Intact function No programming changes made Auto testing/cap confirm testing done today and recommended on both RV and LV leads today   2. HTN    Looks good   3. 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  The patient did not have any concerns regarding medicines.  Bonney Charlies Arthur, PA-C 03/18/2024 8:54 AM     [facility] [address] [place] [address] [phone] (office)  [phone] (fax)

## 2024-03-20 ENCOUNTER — Ambulatory Visit: Attending: Physician Assistant | Admitting: Physician Assistant

## 2024-03-20 ENCOUNTER — Encounter: Payer: Self-pay | Admitting: Physician Assistant

## 2024-03-20 VITALS — BP 130/82 | HR 64 | Ht 66.5 in | Wt 145.1 lb

## 2024-03-20 DIAGNOSIS — I10 Essential (primary) hypertension: Secondary | ICD-10-CM | POA: Diagnosis not present

## 2024-03-20 DIAGNOSIS — I429 Cardiomyopathy, unspecified: Secondary | ICD-10-CM

## 2024-03-20 DIAGNOSIS — I442 Atrioventricular block, complete: Secondary | ICD-10-CM | POA: Diagnosis not present

## 2024-03-20 DIAGNOSIS — Z950 Presence of cardiac pacemaker: Secondary | ICD-10-CM

## 2024-03-20 LAB — CUP PACEART INCLINIC DEVICE CHECK
Date Time Interrogation Session: 20251009110759
Implantable Lead Connection Status: 753985
Implantable Lead Connection Status: 753985
Implantable Lead Connection Status: 753985
Implantable Lead Implant Date: 20130917
Implantable Lead Implant Date: 20130917
Implantable Lead Implant Date: 20231208
Implantable Lead Location: 753858
Implantable Lead Location: 753859
Implantable Lead Location: 753860
Implantable Lead Model: 1944
Implantable Lead Model: 1948
Implantable Pulse Generator Implant Date: 20231208
Lead Channel Pacing Threshold Amplitude: 0.5 V
Lead Channel Pacing Threshold Amplitude: 0.625 V
Lead Channel Pacing Threshold Amplitude: 0.875 V
Lead Channel Pacing Threshold Pulse Width: 0.5 ms
Lead Channel Pacing Threshold Pulse Width: 0.5 ms
Lead Channel Pacing Threshold Pulse Width: 0.5 ms
Lead Channel Sensing Intrinsic Amplitude: 2.9 mV
Pulse Gen Model: 3222
Pulse Gen Serial Number: 3997337

## 2024-03-20 MED ORDER — METOPROLOL SUCCINATE ER 25 MG PO TB24: 12.5000 mg | ORAL_TABLET | Freq: Every day | ORAL | Status: DC

## 2024-03-20 NOTE — Patient Instructions (Signed)
 Medication Instructions:  Change metoprolol  succinate (Toprol  XL) to taking 1/2 tablet (12.5 mg) daily *If you need a refill on your cardiac medications before your next appointment, please call your pharmacy*  Lab Work: None ordered If you have labs (blood work) drawn today and your tests are completely normal, you will receive your results only by: MyChart Message (if you have MyChart) OR A paper copy in the mail If you have any lab test that is abnormal or we need to change your treatment, we will call you to review the results.  Testing/Procedures: Your physician has requested that you have an echocardiogram. Echocardiography is a painless test that uses sound waves to create images of your heart. It provides your doctor with information about the size and shape of your heart and how well your heart's chambers and valves are working. This procedure takes approximately one hour. There are no restrictions for this procedure. Please do NOT wear cologne, perfume, aftershave, or lotions (deodorant is allowed). Please arrive 15 minutes prior to your appointment time.  Please note: We ask at that you not bring children with you during ultrasound (echo/ vascular) testing. Due to room size and safety concerns, children are not allowed in the ultrasound rooms during exams. Our front office staff cannot provide observation of children in our lobby area while testing is being conducted. An adult accompanying a patient to their appointment will only be allowed in the ultrasound room at the discretion of the ultrasound technician under special circumstances. We apologize for any inconvenience.   Follow-Up: At [facility], you and your health needs are our priority.  As part of our continuing mission to provide you with exceptional heart care, our providers are all part of one team.  This team includes your primary Cardiologist (physician) and Advanced Practice Providers or APPs (Physician  Assistants and Nurse Practitioners) who all work together to provide you with the care you need, when you need it.  Your next appointment:   1 year(s)  Provider:   Soyla Norton, MD or Charlies Arthur, PA-C

## 2024-03-23 ENCOUNTER — Ambulatory Visit: Payer: Self-pay | Admitting: Cardiology

## 2024-04-05 ENCOUNTER — Other Ambulatory Visit: Payer: Self-pay | Admitting: Physician Assistant

## 2024-04-24 ENCOUNTER — Ambulatory Visit (HOSPITAL_COMMUNITY)
Admission: RE | Admit: 2024-04-24 | Discharge: 2024-04-24 | Disposition: A | Discharge status: Home / Self Care | Source: Ambulatory Visit | Attending: Physician Assistant | Admitting: Physician Assistant

## 2024-04-24 DIAGNOSIS — I10 Essential (primary) hypertension: Secondary | ICD-10-CM | POA: Insufficient documentation

## 2024-04-24 DIAGNOSIS — R931 Abnormal findings on diagnostic imaging of heart and coronary circulation: Secondary | ICD-10-CM | POA: Diagnosis not present

## 2024-04-24 DIAGNOSIS — E119 Type 2 diabetes mellitus without complications: Secondary | ICD-10-CM | POA: Insufficient documentation

## 2024-04-24 DIAGNOSIS — E785 Hyperlipidemia, unspecified: Secondary | ICD-10-CM | POA: Diagnosis not present

## 2024-04-24 DIAGNOSIS — I429 Cardiomyopathy, unspecified: Secondary | ICD-10-CM | POA: Diagnosis present

## 2024-04-24 LAB — ECHOCARDIOGRAM COMPLETE
AR max vel: 2.51 cm2
AV Area VTI: 2.65 cm2
AV Area mean vel: 2.37 cm2
AV Mean grad: 3 mmHg
AV Peak grad: 4.6 mmHg
Ao pk vel: 1.07 m/s
Area-P 1/2: 3.77 cm2
S' Lateral: 3.5 cm

## 2024-05-14 ENCOUNTER — Ambulatory Visit: Payer: Medicare Other

## 2024-05-19 ENCOUNTER — Ambulatory Visit

## 2024-05-20 LAB — CUP PACEART REMOTE DEVICE CHECK
Battery Remaining Longevity: 73 mo
Battery Remaining Percentage: 73 %
Battery Voltage: 2.98 V
Brady Statistic AP VP Percent: 8.6 %
Brady Statistic AP VS Percent: 1 %
Brady Statistic AS VP Percent: 91 %
Brady Statistic AS VS Percent: 1 %
Brady Statistic RA Percent Paced: 8.5 %
Date Time Interrogation Session: 20251208040013
Implantable Lead Connection Status: 753985
Implantable Lead Connection Status: 753985
Implantable Lead Connection Status: 753985
Implantable Lead Implant Date: 20130917
Implantable Lead Implant Date: 20130917
Implantable Lead Implant Date: 20231208
Implantable Lead Location: 753858
Implantable Lead Location: 753859
Implantable Lead Location: 753860
Implantable Lead Model: 1944
Implantable Lead Model: 1948
Implantable Pulse Generator Implant Date: 20231208
Lead Channel Impedance Value: 550 Ohm
Lead Channel Impedance Value: 690 Ohm
Lead Channel Impedance Value: 710 Ohm
Lead Channel Pacing Threshold Amplitude: 0.5 V
Lead Channel Pacing Threshold Amplitude: 0.75 V
Lead Channel Pacing Threshold Amplitude: 1 V
Lead Channel Pacing Threshold Pulse Width: 0.5 ms
Lead Channel Pacing Threshold Pulse Width: 0.5 ms
Lead Channel Pacing Threshold Pulse Width: 0.5 ms
Lead Channel Sensing Intrinsic Amplitude: 2.9 mV
Lead Channel Sensing Intrinsic Amplitude: 8.5 mV
Lead Channel Setting Pacing Amplitude: 2 V
Lead Channel Setting Pacing Amplitude: 2 V
Lead Channel Setting Pacing Amplitude: 2 V
Lead Channel Setting Pacing Pulse Width: 0.5 ms
Lead Channel Setting Pacing Pulse Width: 0.5 ms
Lead Channel Setting Sensing Sensitivity: 4 mV
Pulse Gen Model: 3222
Pulse Gen Serial Number: 3997337

## 2024-05-21 NOTE — Progress Notes (Signed)
 Remote PPM Transmission

## 2024-05-22 ENCOUNTER — Ambulatory Visit: Payer: Self-pay | Admitting: Cardiology

## 2024-08-18 ENCOUNTER — Encounter

## 2024-11-17 ENCOUNTER — Encounter

## 2025-02-17 ENCOUNTER — Encounter

## 2025-05-18 ENCOUNTER — Encounter

## 2025-08-17 ENCOUNTER — Encounter
